# Patient Record
Sex: Male | Born: 1975 | Race: Black or African American | Hispanic: No | Marital: Married | Smoking: Current every day smoker
Health system: Southern US, Community
[De-identification: ages and names within clinical notes are randomized; demographics above are authoritative.]

## PROBLEM LIST (undated history)

## (undated) DIAGNOSIS — F32A Depression, unspecified: Secondary | ICD-10-CM

## (undated) DIAGNOSIS — F1911 Other psychoactive substance abuse, in remission: Secondary | ICD-10-CM

## (undated) DIAGNOSIS — F1910 Other psychoactive substance abuse, uncomplicated: Secondary | ICD-10-CM

## (undated) DIAGNOSIS — A150 Tuberculosis of lung: Secondary | ICD-10-CM

## (undated) DIAGNOSIS — F329 Major depressive disorder, single episode, unspecified: Secondary | ICD-10-CM

## (undated) DIAGNOSIS — R519 Headache, unspecified: Secondary | ICD-10-CM

## (undated) HISTORY — DX: Other psychoactive substance abuse, uncomplicated: F19.10

## (undated) HISTORY — DX: Headache, unspecified: R51.9

## (undated) HISTORY — DX: Depression, unspecified: F32.A

## (undated) HISTORY — DX: Major depressive disorder, single episode, unspecified: F32.9

## (undated) HISTORY — DX: Other psychoactive substance abuse, in remission: F19.11

---

## 2000-11-26 ENCOUNTER — Emergency Department (HOSPITAL_COMMUNITY): Admission: EM | Admit: 2000-11-26 | Discharge: 2000-11-26 | Payer: Self-pay

## 2005-09-29 ENCOUNTER — Emergency Department (HOSPITAL_COMMUNITY): Admission: EM | Admit: 2005-09-29 | Discharge: 2005-09-29 | Payer: Self-pay | Admitting: Emergency Medicine

## 2007-01-30 ENCOUNTER — Emergency Department (HOSPITAL_COMMUNITY): Admission: EM | Admit: 2007-01-30 | Discharge: 2007-01-30 | Payer: Self-pay | Admitting: Emergency Medicine

## 2008-12-07 ENCOUNTER — Emergency Department (HOSPITAL_COMMUNITY): Admission: EM | Admit: 2008-12-07 | Discharge: 2008-12-07 | Payer: Self-pay | Admitting: Emergency Medicine

## 2010-10-16 LAB — POCT I-STAT, CHEM 8
Chloride: 101 mEq/L (ref 96–112)
Glucose, Bld: 87 mg/dL (ref 70–99)
HCT: 45 % (ref 39.0–52.0)
Hemoglobin: 15.3 g/dL (ref 13.0–17.0)
Potassium: 3.9 mEq/L (ref 3.5–5.1)
Sodium: 140 mEq/L (ref 135–145)

## 2010-10-16 LAB — RAPID URINE DRUG SCREEN, HOSP PERFORMED
Amphetamines: NOT DETECTED
Benzodiazepines: NOT DETECTED
Tetrahydrocannabinol: POSITIVE — AB

## 2011-01-31 ENCOUNTER — Emergency Department: Payer: Self-pay | Admitting: Unknown Physician Specialty

## 2011-04-23 LAB — URINALYSIS, ROUTINE W REFLEX MICROSCOPIC
Bilirubin Urine: NEGATIVE
Nitrite: NEGATIVE
Specific Gravity, Urine: 1.027
Urobilinogen, UA: 1

## 2011-04-23 LAB — URINE CULTURE: Culture: NO GROWTH

## 2011-04-23 LAB — URINE MICROSCOPIC-ADD ON

## 2013-01-06 ENCOUNTER — Emergency Department: Payer: Self-pay | Admitting: Emergency Medicine

## 2013-06-25 ENCOUNTER — Emergency Department: Payer: Self-pay | Admitting: Emergency Medicine

## 2013-06-25 LAB — RAPID INFLUENZA A&B ANTIGENS

## 2013-12-29 ENCOUNTER — Emergency Department: Payer: Self-pay | Admitting: Emergency Medicine

## 2014-04-09 ENCOUNTER — Inpatient Hospital Stay: Payer: Self-pay | Admitting: Psychiatry

## 2014-04-09 LAB — URINALYSIS, COMPLETE
BILIRUBIN, UR: NEGATIVE
Bacteria: NONE SEEN
Glucose,UR: NEGATIVE mg/dL (ref 0–75)
Ketone: NEGATIVE
LEUKOCYTE ESTERASE: NEGATIVE
Nitrite: NEGATIVE
PH: 6 (ref 4.5–8.0)
PROTEIN: NEGATIVE
RBC,UR: <1 /HPF (ref 0–5)
SPECIFIC GRAVITY: 1.002 (ref 1.003–1.030)
Squamous Epithelial: <1
WBC UR: NONE SEEN /HPF (ref 0–5)

## 2014-04-09 LAB — CBC
HCT: 40.4 % (ref 40.0–52.0)
HGB: 13.3 g/dL (ref 13.0–18.0)
MCH: 29.6 pg (ref 26.0–34.0)
MCHC: 32.9 g/dL (ref 32.0–36.0)
MCV: 90 fL (ref 80–100)
Platelet: 165 10*3/uL (ref 150–440)
RBC: 4.48 10*6/uL (ref 4.40–5.90)
RDW: 13.1 % (ref 11.5–14.5)
WBC: 12 10*3/uL — AB (ref 3.8–10.6)

## 2014-04-09 LAB — DRUG SCREEN, URINE
Amphetamines, Ur Screen: NEGATIVE (ref ?–1000)
Barbiturates, Ur Screen: NEGATIVE (ref ?–200)
Benzodiazepine, Ur Scrn: NEGATIVE (ref ?–200)
CANNABINOID 50 NG, UR ~~LOC~~: NEGATIVE (ref ?–50)
Cocaine Metabolite,Ur ~~LOC~~: POSITIVE (ref ?–300)
MDMA (Ecstasy)Ur Screen: NEGATIVE (ref ?–500)
METHADONE, UR SCREEN: NEGATIVE (ref ?–300)
Opiate, Ur Screen: NEGATIVE (ref ?–300)
Phencyclidine (PCP) Ur S: NEGATIVE (ref ?–25)
TRICYCLIC, UR SCREEN: NEGATIVE (ref ?–1000)

## 2014-04-09 LAB — COMPREHENSIVE METABOLIC PANEL
ALBUMIN: 4.2 g/dL (ref 3.4–5.0)
ALK PHOS: 64 U/L
ANION GAP: 4 — AB (ref 7–16)
AST: 31 U/L (ref 15–37)
BUN: 8 mg/dL (ref 7–18)
Bilirubin,Total: 0.4 mg/dL (ref 0.2–1.0)
CHLORIDE: 104 mmol/L (ref 98–107)
CREATININE: 1.02 mg/dL (ref 0.60–1.30)
Calcium, Total: 9 mg/dL (ref 8.5–10.1)
Co2: 30 mmol/L (ref 21–32)
EGFR (Non-African Amer.): >60
GLUCOSE: 115 mg/dL — AB (ref 65–99)
OSMOLALITY: 275 (ref 275–301)
Potassium: 3.9 mmol/L (ref 3.5–5.1)
SGPT (ALT): 30 U/L
Sodium: 138 mmol/L (ref 136–145)
Total Protein: 7.9 g/dL (ref 6.4–8.2)

## 2014-04-09 LAB — SALICYLATE LEVEL: Salicylates, Serum: <1.7 mg/dL

## 2014-04-09 LAB — ACETAMINOPHEN LEVEL: Acetaminophen: <2 ug/mL

## 2014-04-09 LAB — ETHANOL

## 2014-05-08 ENCOUNTER — Emergency Department: Payer: Self-pay | Admitting: Emergency Medicine

## 2014-07-09 HISTORY — PX: HERNIA REPAIR: SHX51

## 2014-07-13 ENCOUNTER — Emergency Department: Payer: Self-pay | Admitting: Emergency Medicine

## 2014-07-28 ENCOUNTER — Emergency Department: Payer: Self-pay | Admitting: Emergency Medicine

## 2014-08-09 ENCOUNTER — Ambulatory Visit: Payer: Self-pay | Admitting: Surgery

## 2014-08-16 ENCOUNTER — Ambulatory Visit: Payer: Self-pay | Admitting: Surgery

## 2014-10-30 NOTE — H&P (Signed)
PATIENT NAME:  Russell Whitehead, Russell Whitehead MR#:  [id number] DATE OF BIRTH:  1975-08-01  DATE OF ADMISSION:  04/09/2014  REFERRING PHYSICIAN: Dr. Glennie Isle.  ATTENDING PHYSICIAN: Dr. Kristine Linea.  IDENTIFYING DATA: Russell Whitehead is a 39 year old man with history of substance abuse.   CHIEF COMPLAINT: "I'm going to blow my brains out if I cannot stop."  HISTORY OF PRESENT ILLNESS: Russell Whitehead has a long history of cocaine addiction. He was at [facility] more than 14 years ago for the same and reports some period of sobriety following treatment. Recently, however, he has not been able to stay away from cocaine. 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 This month, he blew his paycheck already. He did not pay child support and was unable to help his girlfriend who had some financial emergency situation. It made him feel very bad and he became acutely suicidal wanting to shoot himself. Instead he came to the Emergency Room. He reports some symptoms of depression with poor sleep, decreased appetite, anhedonia, feeling of guilt, hopelessness, worthlessness, social isolation, and now suicidal thoughts. He also endorses mood swings, and tells me that in the past he was thought to be bipolar. He has never felt so down before. He has never been treated for bipolar. He is not in the care of a psychiatrist. He denies psychotic symptoms, denies symptoms suggestive of bipolar mania. He denies other than cocaine substance use   PAST PSYCHIATRIC HISTORY: As above, 1 treatment at [facility], possibly in 2011 with some sobriety. It helps him to stick with AA and AN people. He never attempted suicide, has never been hospitalized for psychiatric reasons, takes no psychiatric medication.   FAMILY PSYCHIATRIC HISTORY: Father with depression and substance use.   PAST MEDICAL HISTORY: None.   ALLERGIES: No known drug allergies.   MEDICATIONS ON ADMISSION: None.   SOCIAL HISTORY: As  above. Lives at a boarding house, has a girlfriend, has 3 children from previous relationships and is supposed to pay child support. He works in [employer] representative and is able to make good money but it supports his crack habit entirely. He has no health insurance.   REVIEW OF SYSTEMS:  CONSTITUTIONAL: No fevers or chills. No weight changes.  EYES: No double or blurred vision.  ENT: No hearing loss.  RESPIRATORY: No shortness of breath or cough.  CARDIOVASCULAR: No chest pain or orthopnea.  GASTROINTESTINAL: No abdominal pain, nausea, vomiting, or diarrhea.  GENITOURINARY: No incontinence or frequency.  ENDOCRINE: No heat or cold intolerance.  LYMPHATIC: No anemia or easy bruising.  INTEGUMENTARY: No acne or rash.  MUSCULOSKELETAL: No muscle or joint pain.  NEUROLOGIC: No tingling or weakness.  PSYCHIATRIC: See history of present illness for details.   PHYSICAL EXAMINATION:  VITAL SIGNS: Blood pressure 116/78, pulse 78, respirations 18, temperature 98.  GENERAL: This is a well-developed male in no acute distress.  HEENT: The pupils are equal, round, and reactive to light. Sclerae are anicteric.  NECK: Supple. No thyromegaly.  LUNGS: Clear to auscultation. No dullness to percussion.  HEART: Regular rhythm and rate. No murmurs, rubs, or gallops.  ABDOMEN: Soft, nontender, nondistended. Positive bowel sounds.  MUSCULOSKELETAL: Normal muscle strength in all extremities.  SKIN: No rashes or bruises.  LYMPHATIC: No cervical adenopathy.  NEUROLOGIC: Cranial nerves II through XII are intact.   LABORATORY DATA: Chemistries are within normal limits. Blood glucose is 115, alcohol zero. LFTs within normal limits. Urine tox screen positive  for cocaine. CBC within normal limits except for white count of 12,000. The urinalysis is not suggestive of urinary tract infection. Serum acetaminophen and salicylates are low.   MENTAL STATUS EXAMINATION ON ADMISSION: The patient is alert and oriented to person,  place, time, and situation. He is pleasant, polite, and cooperative. He maintains limited eye contact. He is well groomed, wearing hospital scrubs. His speech is of normal rhythm, rate, and volume. Mood is depressed with flat affect. Thought process is logical and goal oriented. Thought content: He denies thoughts of hurting himself or others here, but was admitted after threatening to blow his brains out. He denies delusions or paranoia. There are no auditory or visual hallucinations. His cognition is grossly intact. Registration, recall, short and long-term memory are intact. He is of average intelligence and fund of knowledge. His insight and judgment are limited.   SUICIDE RISK ASSESSMENT ON ADMISSION: This is a patient with a long history of cocaine dependence who became critically aware of the consequences of his habit on his family life, became depressed and suicide.   INITIAL DIAGNOSES:   AXIS I: Major depressive episode, severe, cocaine use disorder, severe.   AXIS II: Deferred.   AXIS III: Deferred.   PLAN: The patient was admitted to [facility] Behavioral Medicine unit for safety, stabilization, and medication management. He was initially placed on suicide precautions and was closely monitored for any unsafe behaviors. He underwent full psychiatric and risk assessment. He received pharmacotherapy, individual and group psychotherapy, substance abuse counseling, and support from therapeutic milieu.  1. Suicidal ideation. He is able to contract for safety in the hospital. 2. Mood. The patient endorses mood swings up and down, hyperactivity, racing thoughts, insomnia. This may well be related to cocaine. We will offer Tegretol and see if he can tolerate it.  3. Insomnia. He will be given trazodone 100 mg.  4. Substance abuse. He probably will not be accepted to [facility] as cocaine is his only substance of abuse so we will give them a call on Monday.   DISPOSITION: Most  likely back to his boarding house.    ____________________________ Ellin GoodieJolanta B. Jennet MaduroPucilowska, MD jbp:lt D: 04/09/2014 18:33:26 ET T: 04/09/2014 19:24:27 ET JOB#: [id number]  cc: Jolanta B. Jennet MaduroPucilowska, MD, <Dictator> Shari ProwsJOLANTA B PUCILOWSKA MD ELECTRONICALLY SIGNED 04/15/2014 0:53

## 2014-10-30 NOTE — Consult Note (Signed)
PATIENT NAME:  Russell Whitehead, Russell Whitehead MR#:  [id number] DATE OF BIRTH:  Nov 19, 1975  DATE OF CONSULTATION:  04/09/2014  REFERRING PHYSICIAN:   CONSULTING PHYSICIAN:  Audery AmelJohn T. Holy Battenfield, MD  IDENTIFYING INFORMATION AND REASON FOR CONSULT: A 39 year old man who presented voluntarily to the Emergency Room.   CHIEF COMPLAINT: "I'm going to blow my brains out if I can't stop."   HISTORY OF PRESENT ILLNESS: The patient states that he is feeling depressed, overwhelmed, and having suicidal thoughts including thoughts about shooting himself. He is feeling bad for the last several days with an escalation in the last 24 hours. Major stress is that he is addicted to crack cocaine. Spends all of his paycheck on cocaine. His girlfriend needed some money for an emergency and he was unable to help her out which made him feel terrible about himself. He sleeps poorly at night. Does not have any other specific physical symptoms except for low appetite currently. Denies that he is abusing alcohol, not using any other drugs. Not on any psychiatric medicine.   PAST PSYCHIATRIC HISTORY: He went to [facility] in 2001, says that he had some sobriety after that and has been able to have some extended periods of outpatient sobriety going to Alcoholics Anonymous. Denies any history of suicide attempts in the past, never been prescribed any medicine for any psychiatric problem, has never been in a psychiatric hospital.   SOCIAL HISTORY: The patient currently lives in a boarding house. He does work doing Aeronautical engineerlandscaping and heavy labor. He has a girlfriend that he feels responsible for.   FAMILY HISTORY: Father with heavy substance abuse problem and a history of major depression.   PAST MEDICAL HISTORY: No significant ongoing medical problems.   CURRENT MEDICATIONS: None.   ALLERGIES: No known drug allergies.   SUBSTANCE ABUSE HISTORY: Several years of abuse of crack cocaine going back to nearly 2000s. Has had some extended sobriety. He denies  that he has addiction problems with other drugs.   REVIEW OF SYSTEMS: Depressed mood, suicidal ideation. No hallucinations, no delusions. Feeling tired and a little weak.  No nausea. The rest of the whole 9 point review of systems is negative.   MENTAL STATUS EXAMINATION: Neatly groomed man, looks a little underweight, cooperative with the interview. Good eye contact. Normal psychomotor activity. Speech is quiet, decreased in amount. Affect flat, tearful at times. Mood stated as depressed. Thoughts are lucid. No evidence of loosening of associations or delusions. Denies auditory or visual hallucinations. Denies any homicidal ideation. Positive suicidal ideation. Alert and oriented x 4. Can recall 3 out of 3 objects immediately and at 3 minutes. Long-term memory intact. Judgment and insight intact.   LABORATORY RESULTS: Chemistry panel, just a slightly elevated glucose of 115. Alcohol level negative. Drug screen positive for cocaine. CBC, elevated white count at 12, otherwise unremarkable.   PHYSICAL EXAMINATION: No acute physical injury or distress. Able to move all extremities, able to walk unassisted. Blood pressure 116/78, respirations 18, pulse 78, temperature 98.   ASSESSMENT: A 39 year old man with suicidal ideation, severe depression, fatigue, tearfulness, poor functioning, major stress is cocaine addiction, but requires hospitalization because of suicidality.   TREATMENT PLAN: Admit to psychiatry. Suicide, elopement, and fall precautions. Trazodone p.r.n. for sleep. Medicine provided for p.r.n. anxiety.   DIAGNOSIS PRINCIPAL AND PRIMARY:   AXIS I: Major depression, single, moderate to severe.   SECONDARY DIAGNOSES:   AXIS I: Cocaine abuse.   AXIS II: Deferred.     ____________________________ Audery AmelJohn T. Deosha Werden,  MD jtc:bu D: 04/09/2014 15:09:05 ET T: 04/09/2014 15:17:13 ET JOB#: [id number]  cc: Audery Amel, MD, <Dictator> Audery Amel MD ELECTRONICALLY SIGNED 04/15/2014  16:08

## 2014-11-07 NOTE — Op Note (Signed)
PATIENT NAME:  Archie EndoMOORE, Beau G MR#:  [id number] DATE OF BIRTH:  May 27, 1976  DATE OF PROCEDURE:  08/16/2014  PREOPERATIVE DIAGNOSIS:  Left inguinal hernia.   POSTOPERATIVE DIAGNOSIS:  Left indirect inguinal hernia.   PROCEDURE PERFORMED:  Robotically-assisted laparoscopic transabdominal preperitoneal herniorrhaphy with mesh.   SURGEON:  Bohdan Macho A. Egbert GaribaldiBird, MD    ESTIMATED BLOOD LOSS:  Minimal.   DESCRIPTION OF PROCEDURE:  With informed consent in the supine position, general endotracheal anesthesia was induced. The patient's abdomen was widely clipped of hair and sterilely prepped and draped with ChloraPrep solution. A Foley catheter was placed under sterile technique. A timeout was observed. A Veress needle was placed through a transversely-oriented skin incision above the umbilicus in the midline with elevation of the fascia with a hook elevator. A 10 mm da Vinci camera port was then placed under direct visualization and confirmation of intraperitoneal position with the camera was obtained. Then, 8.5 mm da Vinci ports were then placed on either side of the midline, 10 cm lateral to the camera port. A 10 mm bladeless trocar was placed in the left upper quadrant as the assistant port. The patient cart was then docked. Instruments were then inserted under direct visualization. This was achieved with the patient in steep Trendelenburg.   I then moved to the console. A large indirect inguinal hernia sac was identified. The peritoneum was incised from medially to laterally over the epigastric vessels, and dissection was then performed in the preperitoneal space. Cooper ligament was identified and delineated. The Spermatic cord was identified, and preservation of the ilioinguinal nerve was achieved. Dissection of the indirect inguinal hernia space demonstrated the vas and vessels medially and the hernia sac being liberated from the canal fully into the abdominal cavity, and the lipoma of the cord was also reduced.  A Bard 3DMax medium left-sided hernia patch was then brought into the field, inserted into the defect, and secured with #0 Vicryl suture twice on the Cooper ligament and several points on the anterior abdominal wall and 1 laterally, avoiding the area of the nerve. The mesh covered the hernia defect nicely. The peritoneum was then reapproximated utilizing a running V-Loc suture. Hemostasis appeared to be adequate on the operative field, and the ports were then removed under direct visualization.   The fascial defects were then reapproximated with #0 Vicryl suture and 4-0 Vicryl subcuticular in the skin. Benzoin, Steri-Strips, Telfa, and Tegaderm were then applied. The Foley catheter was removed. The patient was then subsequently extubated and taken to the recovery room in stable and satisfactory condition by anesthesia services.    ____________________________ Redge GainerMark A. Egbert GaribaldiBird, MD mab:nb D: 08/16/2014 22:08:31 ET T: 08/17/2014 05:03:09 ET JOB#: [id number]  cc: Loraine LericheMark A. Egbert GaribaldiBird, MD, <Dictator> Raynald KempMARK A Gay Moncivais MD ELECTRONICALLY SIGNED 08/24/2014 10:59

## 2015-05-20 ENCOUNTER — Emergency Department
Admission: EM | Admit: 2015-05-20 | Discharge: 2015-05-20 | Disposition: A | Discharge status: Home / Self Care | Payer: Self-pay | Attending: Emergency Medicine | Admitting: Emergency Medicine

## 2015-05-20 DIAGNOSIS — K029 Dental caries, unspecified: Secondary | ICD-10-CM | POA: Insufficient documentation

## 2015-05-20 DIAGNOSIS — G8929 Other chronic pain: Secondary | ICD-10-CM | POA: Insufficient documentation

## 2015-05-20 DIAGNOSIS — K0889 Other specified disorders of teeth and supporting structures: Secondary | ICD-10-CM | POA: Insufficient documentation

## 2015-05-20 DIAGNOSIS — K089 Disorder of teeth and supporting structures, unspecified: Secondary | ICD-10-CM

## 2015-05-20 DIAGNOSIS — H9202 Otalgia, left ear: Secondary | ICD-10-CM | POA: Insufficient documentation

## 2015-05-20 MED ORDER — TRAMADOL HCL 50 MG PO TABS: 50.0000 mg | ORAL_TABLET | Freq: Four times a day (QID) | ORAL | Status: DC | PRN

## 2015-05-20 MED ORDER — AMOXICILLIN 500 MG PO CAPS: 500.0000 mg | ORAL_CAPSULE | Freq: Three times a day (TID) | ORAL | Status: DC

## 2015-05-20 MED ORDER — IBUPROFEN 800 MG PO TABS: 800.0000 mg | ORAL_TABLET | Freq: Three times a day (TID) | ORAL | Status: DC

## 2015-05-20 NOTE — Discharge Instructions (Signed)
Been taking amoxicillin today for 10 days. Ibuprofen as needed for pain 3 times a day with food. Tramadol every 6 hours if needed for severe pain. YOU may also take Tylenol along with this medication. YOU will need to make an appointment with one of the dental clinics listed below.  OPTIONS FOR DENTAL FOLLOW UP CARE  [place] [facility] - Local Safety Net Dental Clinics [email or web address]   [facility] [phone])  [facility] Ma [phone])  [place] [phone])  [facility] [phone])  [facility] [phone]) This clinic caters to the indigent population and is on a lottery system. Location: [employer] [facility], [employer], [address] [place] Clinic Hours: Wednesdays from 6pm - 9pm, patients seen by a lottery system. For dates, call or go to [email or web address] Services: Cleanings, fillings and simple extractions. Payment Options: DENTAL WORK IS FREE OF CHARGE. Bring proof of income or support. Best way to get seen: Arrive at 5:15 pm - this is a lottery, NOT first come/first serve, so arriving earlier will not increase your chances of being seen.     [facility] [phone] Select option 1 for emergencies   Location: [facility], [place], [address] [place] Clinic Hours: No walk-ins accepted - call the day before to schedule an appointment. Check in times are 9:30 am and 1:30 pm. Services: Simple extractions, temporary fillings, pulpectomy/pulp debridement, uncomplicated abscess drainage. Payment Options: PAYMENT IS DUE AT THE TIME OF SERVICE.  Fee is usually $100-200, additional surgical procedures (e.g. abscess drainage) may be extra. Cash, checks, Visa/MasterCard accepted.  Can file Medicaid if patient is covered for  dental - patient should call case worker to check. No discount for [facility] patients. Best way to get seen: MUST call the day before and get onto the schedule. Can usually be seen the next 1-2 days. No walk-ins accepted.     [facility] [phone]   Location: [facility], [address], [place] Clinic Hours: M, W, Th, F 8am or 1:30pm, Tues 9a or 1:30 - first come/first served. Services: Simple extractions, temporary fillings, uncomplicated abscess drainage.  You do not need to be an [facility] resident. Payment Options: PAYMENT IS DUE AT THE TIME OF SERVICE. Dental insurance, otherwise sliding scale - bring proof of income or support. Depending on income and treatment needed, cost is usually $50-200. Best way to get seen: Arrive early as it is first come/first served.     [facility] [phone]   Location: [address] Clinic Hours: Mon-Thu 8a-5p Services: Most basic dental services including extractions and fillings. Payment Options: PAYMENT IS DUE AT THE TIME OF SERVICE. Sliding scale, up to 50% off - bring proof if income or support. Medicaid with dental option accepted. Best way to get seen: Call to schedule an appointment, can usually be seen within 2 weeks OR they will try to see walk-ins - show up at 8a or 2p (you may have to wait).     [facility] [phone] [place] RESIDENTS ONLY   Location: [facility], [address], [place], [place] [address] Clinic Hours: By appointment only. Monday - Thursday 8am-5pm, Friday 8am-12pm Services: Cleanings, fillings, extractions. Payment Options: PAYMENT IS DUE AT THE TIME OF SERVICE. Cash, Visa or MasterCard. Sliding scale - $30 minimum per service. Best way to get seen:  Come in to office, complete packet and make an appointment - need proof of income or support monies for each  household member and proof of [place] residence. Usually takes about a month to get in.     [facility] [phone]   Location: [address] [facility] Hours: Walk-in Urgent Care Dental Services are offered Monday-Friday mornings only. The numbers of emergencies accepted daily is limited to the number of providers available. Maximum 15 - Mondays, Wednesdays & Thursdays Maximum 10 - Tuesdays & Fridays Services: You do not need to be a [place] resident to be seen for a dental emergency. Emergencies are defined as pain, swelling, abnormal bleeding, or dental trauma. Walkins will receive x-rays if needed. NOTE: Dental cleaning is not an emergency. Payment Options: PAYMENT IS DUE AT THE TIME OF SERVICE. Minimum co-pay is $40.00 for uninsured patients. Minimum co-pay is $3.00 for Medicaid with dental coverage. Dental Insurance is accepted and must be presented at time of visit. Medicare does not cover dental. Forms of payment: Cash, credit card, checks. Best way to get seen: If not previously registered with the clinic, walk-in dental registration begins at 7:15 am and is on a first come/first serve basis. If previously registered with the clinic, call to make an appointment.     [facility] [phone] [place] RESIDENTS ONLY   Location: [address], [place], [place] Clinic Hours: Mon-Thu 10a-2p Services: Extractions only! Payment Options: FREE (donations accepted) - bring proof of income or support Best way to get seen: Call and schedule an appointment OR come at 8am on the 1st Monday of every month (except for holidays) when it is first come/first served.     [facility] [phone]   [address]: [address], [place] Clinic Hours: Friday mornings Services, Payment Options, Best way to get seen: Call for info

## 2015-05-20 NOTE — ED Provider Notes (Signed)
[facility] Emergency Department Provider Note  ____________________________________________  Time seen: Approximately 2:18 PM  I have reviewed the triage vital signs and the nursing notes.   HISTORY  Chief Complaint Dental Pain and Otalgia  HPI Russell Whitehead is a 39 y.o. male left-sided facial pain with earache for the last week. Patient states that he has "rotten teeth". He states he has been taking Tylenol at home without any relief. He denies any fever or chills. He states he does not have a dentist.Patient states that he continues to smoke less than half pack of cigarettes per day. He states he is aware that he needs to have something done about his teeth. Currently he rates his pain as an 8 out of 10.   No past medical history on file.  There are no active problems to display for this patient.   No past surgical history on file.  Current Outpatient Rx  Name  Route  Sig  Dispense  Refill  . amoxicillin (AMOXIL) 500 MG capsule   Oral   Take 1 capsule (500 mg total) by mouth 3 (three) times daily.   30 capsule   0   . ibuprofen (ADVIL,MOTRIN) 800 MG tablet   Oral   Take 1 tablet (800 mg total) by mouth 3 (three) times daily.   30 tablet   0   . traMADol (ULTRAM) 50 MG tablet   Oral   Take 1 tablet (50 mg total) by mouth every 6 (six) hours as needed.   10 tablet   0     Allergies Review of patient's allergies indicates no known allergies.  No family history on file.  Social History Social History  Substance Use Topics  . Smoking status: Not on file  . Smokeless tobacco: Not on file  . Alcohol Use: Not on file    Review of Systems Constitutional: No fever/chills ENT: No sore throat. Cardiovascular: Denies chest pain. Respiratory: Denies shortness of breath. Gastrointestinal: No nausea, no vomiting. Musculoskeletal: Negative for back pain. Skin: Negative for rash. Neurological: Negative for headaches, focal weakness or  numbness.  10-point ROS otherwise negative.  ____________________________________________   PHYSICAL EXAM:  VITAL SIGNS: ED Triage Vitals  Enc Vitals Group     BP 05/20/15 1409 125/93 mmHg     Pulse Rate 05/20/15 1409 72     Resp 05/20/15 1409 16     Temp 05/20/15 1409 98.5 F (36.9 C)     Temp Source 05/20/15 1409 Oral     SpO2 05/20/15 1409 99 %     Weight 05/20/15 1409 160 lb (72.576 kg)     Height 05/20/15 1409  (1.778 m)     Head Cir --      Peak Flow --      Pain Score 05/20/15 1408 8     Pain Loc --      Pain Edu? --      Excl. in GC? --     Constitutional: Alert and oriented. Well appearing and in no acute distress. Eyes: Conjunctivae are normal. PERRL. EOMI. Head: Atraumatic. Nose: No congestion/rhinnorhea. Mouth/Throat: Mucous membranes are moist.  Oropharynx non-erythematous. Left upper molars with multiple caries and moderate tenderness of the gums surrounding. There is no obvious abscess formation. There is some left facial edema present. Neck: No stridor.   Hematological/Lymphatic/Immunilogical: No cervical lymphadenopathy. Cardiovascular: Normal rate, regular rhythm. Grossly normal heart sounds.  Good peripheral circulation. Respiratory: Normal respiratory effort.  No retractions. Lungs CTAB.  Gastrointestinal: Soft and nontender. No distention.  Musculoskeletal: No lower extremity tenderness nor edema.  No joint effusions. Neurologic:  Normal speech and language. No gross focal neurologic deficits are appreciated. No gait instability. Skin:  Skin is warm, dry and intact. No rash noted. Psychiatric: Mood and affect are normal. Speech and behavior are normal.  ____________________________________________   LABS (all labs ordered are listed, but only abnormal results are displayed)  Labs Reviewed - No data to display  PROCEDURES  Procedure(s) performed: None  Critical Care performed:  No  ____________________________________________   INITIAL IMPRESSION / ASSESSMENT AND PLAN / ED COURSE  Pertinent labs & imaging results that were available during my care of the patient were reviewed by me and considered in my medical decision making (see chart for details).  Patient was placed on amoxicillin 500 mg 3 times a day for 10 days. He is also given 10 tablets of tramadol to be taken for severe pain. Ibuprofen 800 mg 3 times a day with food. Patient was given a list of dental clinics that he is available to go to since he does not have any insurance. He is encouraged to call these clinics on Monday. ____________________________________________   FINAL CLINICAL IMPRESSION(S) / ED DIAGNOSES  Final diagnoses:  Chronic dental pain      Tommi RumpsRhonda L Aldine Grainger, PA-C 05/20/15 1507  Emily FilbertJonathan E Williams, MD 05/20/15 1535

## 2015-05-20 NOTE — ED Notes (Signed)
Pt reports left sided facial/ear pain from tooth ache for a few days.

## 2016-01-12 ENCOUNTER — Encounter: Payer: Self-pay | Admitting: Emergency Medicine

## 2016-01-12 ENCOUNTER — Emergency Department
Admission: EM | Admit: 2016-01-12 | Discharge: 2016-01-12 | Disposition: A | Discharge status: Home / Self Care | Payer: Self-pay | Attending: Emergency Medicine | Admitting: Emergency Medicine

## 2016-01-12 DIAGNOSIS — H9202 Otalgia, left ear: Secondary | ICD-10-CM | POA: Insufficient documentation

## 2016-01-12 DIAGNOSIS — F17210 Nicotine dependence, cigarettes, uncomplicated: Secondary | ICD-10-CM | POA: Insufficient documentation

## 2016-01-12 DIAGNOSIS — K0889 Other specified disorders of teeth and supporting structures: Secondary | ICD-10-CM | POA: Insufficient documentation

## 2016-01-12 MED ORDER — AMOXICILLIN 500 MG PO CAPS
500.0000 mg | ORAL_CAPSULE | Freq: Once | ORAL | Status: AC
  Administered 2016-01-12: 500 mg via ORAL
  Filled 2016-01-12: qty 1

## 2016-01-12 MED ORDER — AMOXICILLIN 500 MG PO TABS: 500.0000 mg | ORAL_TABLET | Freq: Three times a day (TID) | ORAL | Status: DC

## 2016-01-12 NOTE — ED Provider Notes (Signed)
[facility] Emergency Department Provider Note ____________________________________________  Time seen: Approximately 10:31 PM  I have reviewed the triage vital signs and the nursing notes.   HISTORY  Chief Complaint Otalgia and Dental Pain   HPI Russell Whitehead is a 40 y.o. male who presents to the emergency department for evaluation of left ear pain and dental pain. Symptoms started 2 days ago and have worsened. Ear pain started first, then dental pain. No relief with ibuprofen.  History reviewed. No pertinent past medical history.  There are no active problems to display for this patient.   Past Surgical History  Procedure Laterality Date  . Hernia repair      Current Outpatient Rx  Name  Route  Sig  Dispense  Refill  . amoxicillin (AMOXIL) 500 MG tablet   Oral   Take 1 tablet (500 mg total) by mouth 3 (three) times daily.   30 tablet   0   . ibuprofen (ADVIL,MOTRIN) 800 MG tablet   Oral   Take 1 tablet (800 mg total) by mouth 3 (three) times daily.   30 tablet   0   . traMADol (ULTRAM) 50 MG tablet   Oral   Take 1 tablet (50 mg total) by mouth every 6 (six) hours as needed.   10 tablet   0     Allergies Review of patient's allergies indicates no known allergies.  No family history on file.  Social History Social History  Substance Use Topics  . Smoking status: Current Every Day Smoker -- 0.50 packs/day    Types: Cigarettes  . Smokeless tobacco: None  . Alcohol Use: No    Review of Systems Constitutional: Appears uncomfortable. ENT: Positive for left earache and left side dental pain. Musculoskeletal: Negative for jaw pain/trismus. Skin: Negative  ____________________________________________   PHYSICAL EXAM:  VITAL SIGNS: ED Triage Vitals  Enc Vitals Group     BP 01/12/16 2134 130/82 mmHg     Pulse Rate 01/12/16 2134 65     Resp 01/12/16 2134 18     Temp 01/12/16 2134 98.2 F (36.8 C)     Temp Source 01/12/16  2134 Oral     SpO2 01/12/16 2134 100 %     Weight 01/12/16 2134 155 lb (70.308 kg)     Height 01/12/16 2134 5\' 10"  (1.778 m)     Head Cir --      Peak Flow --      Pain Score 01/12/16 2135 7     Pain Loc --      Pain Edu? --      Excl. in GC? --     Constitutional: Alert and oriented. Well appearing and in no acute distress. Eyes: Conjunctivae are normal.EOMI. Mouth/Throat: Mucous membranes are moist. Oropharynx non-erythematous. Ears: Left EAC erythematous and appears irritated. TM normal. Periodontal Exam    Hematological/Lymphatic/Immunilogical: No cervical lymphadenopathy. Respiratory: Normal respiratory effort.  Musculoskeletal: Full ROM x 4 extremities. Neurologic:  Normal speech and language. No gross focal neurologic deficits are appreciated. Speech is normal. No gait instability. Skin:  Normal Psychiatric: Mood and affect are normal. Speech and behavior are normal.  ____________________________________________   LABS (all labs ordered are listed, but only abnormal results are displayed)  Labs Reviewed - No data to display ____________________________________________   RADIOLOGY  Not indicated. ____________________________________________   PROCEDURES  Procedure(s) performed: None  Critical Care performed: No  ____________________________________________   INITIAL IMPRESSION / ASSESSMENT AND PLAN / ED COURSE  Pertinent labs &  imaging results that were available during my care of the patient were reviewed by me and considered in my medical decision making (see chart for details). Patient will be prescribed Amoxicillin. Patient was advised to see the dentist within 14 days. Also advised to take the antibiotic until finished. Instructed to return to the ER for symptoms that change or worsen if you are unable to schedule an appointment. ____________________________________________   FINAL CLINICAL IMPRESSION(S) / ED DIAGNOSES  Final diagnoses:  Pain,  dental  Otalgia of left ear    Note:  This document was prepared using Dragon voice recognition software and may include unintentional dictation errors.    Chinita PesterCari B Zahirah Cheslock, FNP 01/12/16 2236  Arnaldo NatalPaul F Malinda, MD 01/12/16 [phone]

## 2016-01-12 NOTE — ED Notes (Signed)
Patient presents to ED with c/o LEFT ear pain and left lower toothache for the past two days. Patient reports had similar symptoms in the past, states was given antibiotics and was told to follow up with dentist. Pt reports after taking antibiotics he felt better and did not follow up with antibiotics.

## 2016-01-12 NOTE — Discharge Instructions (Signed)
OPTIONS FOR DENTAL FOLLOW UP CARE ° °[place] Department of Health and Human Services - Local Safety Net Dental Clinics °http://www.ncdhhs.gov/dph/oralhealth/services/safetynetclinics.htm °  °[facility] (336-562-3123) ° °[facility] [place] ([phone]) ° °[facility] ([phone] ext [phone]) ° °[facility] ([phone]) ° °[facility] ([phone]) °This clinic caters to the indigent population and is on a lottery system. °Location: °[facility], [address], [place] °Clinic Hours: °Wednesdays from 6pm - 9pm, patients seen by a lottery system. °For dates, call or go to [email or web address].[facility].edu/[facility]/patients/Dental-[facility] °Services: °Cleanings, fillings and simple extractions. °Payment Options: °DENTAL WORK IS FREE OF CHARGE. Bring proof of income or support. °Best way to get seen: °Arrive at 5:15 pm - this is a lottery, NOT first come/first serve, so arriving earlier will not increase your chances of being seen. °  °  °[facility] °[phone] °Select option 1 for emergencies °  °Location: °[facility], [address], [place] °Clinic Hours: °No walk-ins accepted - call the day before to schedule an appointment. °Check in times are 9:30 am and 1:30 pm. °Services: °Simple extractions, temporary fillings, pulpectomy/pulp debridement, uncomplicated abscess drainage. °Payment Options: °PAYMENT IS DUE AT THE TIME OF SERVICE.  Fee is usually $100-200, additional surgical procedures (e.g. abscess drainage) may be extra. °Cash, checks, Visa/MasterCard accepted.  Can file Medicaid if patient is covered for dental - patient should call case worker to check. °No discount for [facility] patients. °Best way to get seen: °MUST call the day before and get onto the schedule. Can usually be seen the next 1-2 days. No walk-ins accepted. °  °  °[facility] °[phone] °   °Location: °[facility], [address], [place] °Clinic Hours: °M, W, Th, F 8am or 1:30pm, Tues 9a or 1:30 - first come/first served. °Services: °Simple extractions, temporary fillings, uncomplicated abscess drainage.  You do not need to be an [place] resident. °Payment Options: °PAYMENT IS DUE AT THE TIME OF SERVICE. °Dental insurance, otherwise sliding scale - bring proof of income or support. °Depending on income and treatment needed, cost is usually $50-200. °Best way to get seen: °Arrive early as it is first come/first served. °  °  °[facility] °[phone] °  °Location: °[address] °Clinic Hours: °Mon-Thu 8a-5p °Services: °Most basic dental services including extractions and fillings. °Payment Options: °PAYMENT IS DUE AT THE TIME OF SERVICE. °Sliding scale, up to 50% off - bring proof if income or support. °Medicaid with dental option accepted. °Best way to get seen: °Call to schedule an appointment, can usually be seen within 2 weeks OR they will try to see walk-ins - show up at 8a or 2p (you may have to wait). °  °  °[facility] °[phone] °[place] RESIDENTS ONLY °  °Location: °[facility], [address], [place], [place] [address] °Clinic Hours: By appointment only. °Monday - Thursday 8am-5pm, Friday 8am-12pm °Services: Cleanings, fillings, extractions. °Payment Options: °PAYMENT IS DUE AT THE TIME OF SERVICE. °Cash, Visa or MasterCard. Sliding scale - $30 minimum per service. °Best way to get seen: °Come in to office, complete packet and make an appointment - need proof of income °or support monies for each household member and proof of [place] residence. °Usually takes about a month to get in. °  °  °[facility] °[phone] °  °Location: °[address]   [place] °Clinic Hours: Walk-in Urgent Care Dental Services are offered Monday-Friday  mornings only. °The numbers of emergencies accepted daily is limited to the number of °providers available. °Maximum 15 - Mondays, Wednesdays & Thursdays °Maximum 10 - Tuesdays & Fridays °Services: °You do not need to be a [place] resident to be seen for a dental emergency. °Emergencies are defined as pain, swelling, abnormal bleeding, or dental trauma. Walkins will receive x-rays if needed. °NOTE: Dental cleaning is not an emergency. °Payment Options: °PAYMENT IS DUE AT THE TIME OF SERVICE. °Minimum co-pay is $40.00 for uninsured patients. °Minimum co-pay is $3.00 for Medicaid with dental coverage. °Dental Insurance is accepted and must be presented at time of visit. °Medicare does not cover dental. °Forms of payment: Cash, credit card, checks. °Best way to get seen: °If not previously registered with the clinic, walk-in dental registration begins at 7:15 am and is on a first come/first serve basis. °If previously registered with the clinic, call to make an appointment. °  °  °[facility] °[phone] °[place] RESIDENTS ONLY °  °Location: °[address], [place], [place] °Clinic Hours: °Mon-Thu 10a-2p °Services: Extractions only! °Payment Options: °FREE (donations accepted) - bring proof of income or support °Best way to get seen: °Call and schedule an appointment OR come at 8am on the 1st Monday of every month (except for holidays) when it is first come/first served. °  °  °[facility] °[phone] °  °Location: °[address], [place] °Clinic Hours: °Friday mornings °Services, Payment Options, Best way to get seen: °Call for info °

## 2016-09-14 ENCOUNTER — Emergency Department
Admission: EM | Admit: 2016-09-14 | Discharge: 2016-09-16 | Disposition: A | Discharge status: Other Acute Inpatient Hospital | Payer: Self-pay | Attending: Emergency Medicine | Admitting: Emergency Medicine

## 2016-09-14 DIAGNOSIS — R45851 Suicidal ideations: Secondary | ICD-10-CM

## 2016-09-14 DIAGNOSIS — F1410 Cocaine abuse, uncomplicated: Secondary | ICD-10-CM

## 2016-09-14 DIAGNOSIS — Z791 Long term (current) use of non-steroidal anti-inflammatories (NSAID): Secondary | ICD-10-CM | POA: Insufficient documentation

## 2016-09-14 DIAGNOSIS — F319 Bipolar disorder, unspecified: Secondary | ICD-10-CM | POA: Insufficient documentation

## 2016-09-14 DIAGNOSIS — Z79899 Other long term (current) drug therapy: Secondary | ICD-10-CM | POA: Insufficient documentation

## 2016-09-14 DIAGNOSIS — F1994 Other psychoactive substance use, unspecified with psychoactive substance-induced mood disorder: Secondary | ICD-10-CM

## 2016-09-14 DIAGNOSIS — F17210 Nicotine dependence, cigarettes, uncomplicated: Secondary | ICD-10-CM | POA: Insufficient documentation

## 2016-09-14 LAB — URINALYSIS, COMPLETE (UACMP) WITH MICROSCOPIC
BACTERIA UA: NONE SEEN
Bilirubin Urine: NEGATIVE
Glucose, UA: NEGATIVE mg/dL
Ketones, ur: NEGATIVE mg/dL
Leukocytes, UA: NEGATIVE
Nitrite: NEGATIVE
Protein, ur: NEGATIVE mg/dL
SPECIFIC GRAVITY, URINE: 1.006 (ref 1.005–1.030)
pH: 7 (ref 5.0–8.0)

## 2016-09-14 LAB — COMPREHENSIVE METABOLIC PANEL
ALBUMIN: 4.8 g/dL (ref 3.5–5.0)
ALK PHOS: 52 U/L (ref 38–126)
ALT: 11 U/L — ABNORMAL LOW (ref 17–63)
AST: 21 U/L (ref 15–41)
Anion gap: 7 (ref 5–15)
BILIRUBIN TOTAL: 0.6 mg/dL (ref 0.3–1.2)
BUN: 10 mg/dL (ref 6–20)
CALCIUM: 9.7 mg/dL (ref 8.9–10.3)
CO2: 28 mmol/L (ref 22–32)
Chloride: 100 mmol/L — ABNORMAL LOW (ref 101–111)
Creatinine, Ser: 0.98 mg/dL (ref 0.61–1.24)
GFR calc Af Amer: >60 mL/min (ref >60)
GFR calc non Af Amer: >60 mL/min (ref >60)
GLUCOSE: 110 mg/dL — AB (ref 65–99)
Potassium: 3.6 mmol/L (ref 3.5–5.1)
Sodium: 135 mmol/L (ref 135–145)
TOTAL PROTEIN: 8.2 g/dL — AB (ref 6.5–8.1)

## 2016-09-14 LAB — URINE DRUG SCREEN, QUALITATIVE (ARMC ONLY)
Amphetamines, Ur Screen: NOT DETECTED
BARBITURATES, UR SCREEN: NOT DETECTED
Benzodiazepine, Ur Scrn: NOT DETECTED
COCAINE METABOLITE, UR ~~LOC~~: POSITIVE — AB
Cannabinoid 50 Ng, Ur ~~LOC~~: NOT DETECTED
MDMA (ECSTASY) UR SCREEN: NOT DETECTED
METHADONE SCREEN, URINE: NOT DETECTED
Opiate, Ur Screen: NOT DETECTED
Phencyclidine (PCP) Ur S: NOT DETECTED
TRICYCLIC, UR SCREEN: NOT DETECTED

## 2016-09-14 LAB — CBC
HEMATOCRIT: 41.9 % (ref 40.0–52.0)
HEMOGLOBIN: 14.1 g/dL (ref 13.0–18.0)
MCH: 29.8 pg (ref 26.0–34.0)
MCHC: 33.6 g/dL (ref 32.0–36.0)
MCV: 88.6 fL (ref 80.0–100.0)
Platelets: 175 10*3/uL (ref 150–440)
RBC: 4.73 MIL/uL (ref 4.40–5.90)
RDW: 13.5 % (ref 11.5–14.5)
WBC: 11.3 10*3/uL — AB (ref 3.8–10.6)

## 2016-09-14 LAB — ETHANOL: Alcohol, Ethyl (B): <5 mg/dL (ref <5)

## 2016-09-14 LAB — SALICYLATE LEVEL: Salicylate Lvl: <7 mg/dL (ref 2.8–30.0)

## 2016-09-14 LAB — ACETAMINOPHEN LEVEL

## 2016-09-14 NOTE — ED Notes (Signed)
Patient transferred from quad, patient in room 2, nurse received report from [facility] RN

## 2016-09-14 NOTE — ED Notes (Signed)

## 2016-09-14 NOTE — ED Notes (Signed)
Russell Whitehead  From social work in room with at this time
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
## 2016-09-14 NOTE — ED Notes (Signed)
Patient ate 100% of supper and had beverage. No behavioral issues. q 15 minute checks and camera surveillance in progress.

## 2016-09-14 NOTE — ED Notes (Signed)
Patient reports having SI for a few days. Reports having those thoughts before. No Plan. Patient has a hx of bipolar and was on tegretol before but states it was not helping so it was stopped about a year and a half ago. Unclear if it was stopped by the patient or doctor. Patient does have ETOH intake but denies any prior to coming in. Admits to snorting crack PTA. Patient has been doing crack and powdered cocaine for the last 2 months about every couple weeks.

## 2016-09-14 NOTE — ED Notes (Signed)
Report was received from Wendy L., RN; Pt. Verbalizes no complaints or distress; verbalizes having  S.I.; denies having Hi. Continue to monitor with 15 min. Monitoring. 

## 2016-09-14 NOTE — BH Assessment (Signed)
Pt referred to [facility]. Pt chart under review by [facility].
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
## 2016-09-14 NOTE — ED Notes (Signed)
Patient had PM snack. 

## 2016-09-14 NOTE — ED Notes (Signed)
Breakfast was warmed up patient eating

## 2016-09-14 NOTE — ED Provider Notes (Signed)
[facility] Emergency Department Provider Note  ____________________________________________  Time seen: Approximately 5:34 AM  I have reviewed the triage vital signs and the nursing notes.   HISTORY  Chief Complaint Suicidal   HPI Archie EndoBruce G Oswald is a 41 y.o. male a history of bipolar disorder who presents for evaluation of suicidal ideation. Patient reports that he has been off of his Tegretol for a year and a half. Has been having on and off suicidal ideation. Currently for the last few days. No plan. No prior suicide attempts. Patient also endorses that his been using cocaine and crack. Last crack was before coming into the hospital. He snorts both of it. Last cocaine was 2 weeks ago. He denies alcohol. Denies any medical problems.  No past medical history on file.  There are no active problems to display for this patient.   Past Surgical History:  Procedure Laterality Date  . HERNIA REPAIR      Prior to Admission medications   Medication Sig Start Date End Date Taking? Authorizing Provider  amoxicillin (AMOXIL) 500 MG tablet Take 1 tablet (500 mg total) by mouth 3 (three) times daily. 01/12/16   Chinita Pesterari B Triplett, FNP  ibuprofen (ADVIL,MOTRIN) 800 MG tablet Take 1 tablet (800 mg total) by mouth 3 (three) times daily. 05/20/15   Tommi Rumpshonda L Summers, PA-C  traMADol (ULTRAM) 50 MG tablet Take 1 tablet (50 mg total) by mouth every 6 (six) hours as needed. 05/20/15   Tommi Rumpshonda L Summers, PA-C    Allergies Patient has no known allergies.  No family history on file.  Social History Social History  Substance Use Topics  . Smoking status: Current Every Day Smoker    Packs/day: 0.50    Types: Cigarettes  . Smokeless tobacco: Not on file  . Alcohol use No    Review of Systems  Constitutional: Negative for fever. Eyes: Negative for visual changes. ENT: Negative for sore throat. Neck: No neck pain  Cardiovascular: Negative for chest pain. Respiratory:  Negative for shortness of breath. Gastrointestinal: Negative for abdominal pain, vomiting or diarrhea. Genitourinary: Negative for dysuria. Musculoskeletal: Negative for back pain. Skin: Negative for rash. Neurological: Negative for headaches, weakness or numbness. Psych: + SI. No HI  ____________________________________________   PHYSICAL EXAM:  VITAL SIGNS: ED Triage Vitals [09/14/16 0505]  Enc Vitals Group     BP (!) 146/79     Pulse Rate 61     Resp 18     Temp 98 F (36.7 C)     Temp Source Oral     SpO2 99 %     Weight 155 lb (70.3 kg)     Height 5\' 10"  (1.778 m)     Head Circumference      Peak Flow      Pain Score      Pain Loc      Pain Edu?      Excl. in GC?     Constitutional: Alert and oriented. Well appearing and in no apparent distress. HEENT:      Head: Normocephalic and atraumatic.         Eyes: Conjunctivae are normal. Sclera is non-icteric. EOMI. PERRL      Mouth/Throat: Mucous membranes are moist.       Neck: Supple with no signs of meningismus. Cardiovascular: Regular rate and rhythm. No murmurs, gallops, or rubs. 2+ symmetrical distal pulses are present in all extremities. No JVD. Respiratory: Normal respiratory effort. Lungs are clear to auscultation  bilaterally. No wheezes, crackles, or rhonchi.  Gastrointestinal: Soft, non tender, and non distended with positive bowel sounds. No rebound or guarding. Genitourinary: No CVA tenderness. Musculoskeletal: Nontender with normal range of motion in all extremities. No edema, cyanosis, or erythema of extremities. Neurologic: Normal speech and language. Face is symmetric. Moving all extremities. No gross focal neurologic deficits are appreciated. Skin: Skin is warm, dry and intact. No rash noted. Psychiatric: Mood and affect are normal. Speech and behavior are normal. SI with no plan  ____________________________________________   LABS (all labs ordered are listed, but only abnormal results are  displayed)  Labs Reviewed  CBC - Abnormal; Notable for the following:       Result Value   WBC 11.3 (*)    All other components within normal limits  COMPREHENSIVE METABOLIC PANEL - Abnormal; Notable for the following:    Chloride 100 (*)    Glucose, Bld 110 (*)    Total Protein 8.2 (*)    ALT 11 (*)    All other components within normal limits  ACETAMINOPHEN LEVEL - Abnormal; Notable for the following:    Acetaminophen (Tylenol), Serum <10 (*)    All other components within normal limits  ETHANOL  SALICYLATE LEVEL  URINALYSIS, COMPLETE (UACMP) WITH MICROSCOPIC  URINE DRUG SCREEN, QUALITATIVE ([facility] ONLY)   ____________________________________________  EKG  none  ____________________________________________  RADIOLOGY  none  ____________________________________________   PROCEDURES  Procedure(s) performed: None Procedures Critical Care performed:  None ____________________________________________   INITIAL IMPRESSION / ASSESSMENT AND PLAN / ED COURSE   41 y.o. 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 IVC paper initiated. PSych consulted. Labs for medical clearance pending.      Pertinent labs & imaging results that were available during my care of the patient were reviewed by me and considered in my medical decision making (see chart for details).    ____________________________________________   FINAL CLINICAL IMPRESSION(S) / ED DIAGNOSES  Final diagnoses:  Suicidal ideation      NEW MEDICATIONS STARTED DURING THIS VISIT:  New Prescriptions   No medications on file     Note:  This document was prepared using Dragon voice recognition software and may include unintentional dictation errors.    Nita Sickle, MD 09/14/16 [phone]

## 2016-09-14 NOTE — Consult Note (Signed)
[facility] Consult   Reason for Consult:  Consult for 41 year old man came to the hospital with depression and suicidal ideation out-of-control substance abuse Referring Physician:  Quentin Cornwall Patient Identification: Russell Whitehead MRN:  [id number] Principal Diagnosis: Substance induced mood disorder ([place]) Diagnosis:   Patient Active Problem List   Diagnosis Date Noted  . Cocaine abuse [F14.10] 09/14/2016  . Substance induced mood disorder [facility]) [F19.94] 09/14/2016    Total Time spent with patient: 1 hour  Subjective:   GREGARY BLACKARD is a 41 y.o. male patient admitted with "I'm not doing good at all, otherwise I wouldn't be here".  HPI:  Patient interviewed chart reviewed. This is a 40 year old man with a history of substance abuse who came into the hospital with suicidal ideation depression and confusion. He says about 2 weeks ago he left his [place] and relapsed into heavy cocaine use. Has been smoking crack ever since. Last night found himself outside high on cocaine with no place to go. Felt overwhelmed with negativity and suicidal ideation and had himself brought to the hospital. He feels like he can't control his thinking. He doesn't understand himself. Feels down and depressed and hopeless. Hasn't been sleeping very well. He says he had a little bit of alcohol recently but normally doesn't drink and isn't using any other drugs. Not currently getting any kind of mental health treatment.  Social history: He had been residing in an [place]. Sounds like he's been struggling with on and off sobriety for years. He has several children none of whom he stays in regular touch with. Not working right now having lost his job when he relapsed.  Medical history: History of other medical problems no known hypertension diabetes or heart disease.  Substance abuse history: He says he's had problems with cocaine together for about 12 years although he's had extended periods of  sobriety during that time. He has been to the alcohol and drug abuse treatment Center in [place] before.  Past Psychiatric History: No history of suicide attempts. He was prescribed Tegretol at one time in the past for mood stability. He doesn't think it was very helpful for him. He has been a client at [facility] in the past but has dropped out of treatment.  Risk to Self: Suicidal Ideation: Yes-Currently Present Suicidal Intent: No-Not Currently/Within Last 6 Months Is patient at risk for suicide?: Yes Suicidal Plan?: No-Not Currently/Within Last 6 Months Access to Means:  (n/a) What has been your use of drugs/alcohol within the last 12 months?: Cocaine use (hx of alcoholism ) How many times?: 0 Other Self Harm Risks: Drug use  Triggers for Past Attempts: Other (Comment) (n/a) Intentional Self Injurious Behavior: None Risk to Others: Homicidal Ideation: No Thoughts of Harm to Others: No Current Homicidal Intent: No Current Homicidal Plan: No Access to Homicidal Means: No Identified Victim: n/a History of harm to others?: No Assessment of Violence: On admission Violent Behavior Description: n/a Does patient have access to weapons?: No Criminal Charges Pending?: No Does patient have a court date: No Prior Inpatient Therapy: Prior Inpatient Therapy: Yes Prior Therapy Dates: 2015 Prior Therapy Facilty/Provider(s): [facility] Reason for Treatment: SI/SA Prior Outpatient Therapy: Prior Outpatient Therapy: Yes Prior Therapy Dates: Distant past  Prior Therapy Facilty/Provider(s): [facility] Reason for Treatment: Bipolar Disorder  Does patient have an ACCT team?: No Does patient have Intensive In-House Services?  : No Does patient have [facility] services? : No Does patient have P4CC services?: No  Past Medical History:  No past medical history on file.  Past Surgical History:  Procedure Laterality Date  . HERNIA REPAIR     Family History: No family history on file. Family Psychiatric  History: He says  his father had depression and his sister is just generally unstable. Social History:  History  Alcohol Use No     History  Drug use: Unknown    Social History   Social History  . Marital status: Single    Spouse name: N/A  . Number of children: N/A  . Years of education: N/A   Social History Main Topics  . Smoking status: Current Every Day Smoker    Packs/day: 0.50    Types: Cigarettes  . Smokeless tobacco: Not on file  . Alcohol use No  . Drug use: Unknown  . Sexual activity: Not on file   Other Topics Concern  . Not on file   Social History Narrative  . No narrative on file   Additional Social History:    Allergies:  No Known Allergies  Labs:  Results for orders placed or performed during the hospital encounter of 09/14/16 (from the past 48 hour(s))  CBC     Status: Abnormal   Collection Time: 09/14/16  5:06 AM  Result Value Ref Range   WBC 11.3 (H) 3.8 - 10.6 K/uL   RBC 4.73 4.40 - 5.90 MIL/uL   Hemoglobin 14.1 13.0 - 18.0 g/dL   HCT 77.5 61.9 - 71.8 %   MCV 88.6 80.0 - 100.0 fL   MCH 29.8 26.0 - 34.0 pg   MCHC 33.6 32.0 - 36.0 g/dL   RDW 56.9 26.9 - 97.8 %   Platelets 175 150 - 440 K/uL  Comprehensive metabolic panel     Status: Abnormal   Collection Time: 09/14/16  5:06 AM  Result Value Ref Range   Sodium 135 135 - 145 mmol/L   Potassium 3.6 3.5 - 5.1 mmol/L   Chloride 100 (L) 101 - 111 mmol/L   CO2 28 22 - 32 mmol/L   Glucose, Bld 110 (H) 65 - 99 mg/dL   BUN 10 6 - 20 mg/dL   Creatinine, Ser 7.46 0.61 - 1.24 mg/dL   Calcium 9.7 8.9 - 63.5 mg/dL   Total Protein 8.2 (H) 6.5 - 8.1 g/dL   Albumin 4.8 3.5 - 5.0 g/dL   AST 21 15 - 41 U/L   ALT 11 (L) 17 - 63 U/L   Alkaline Phosphatase 52 38 - 126 U/L   Total Bilirubin 0.6 0.3 - 1.2 mg/dL   GFR calc non Af Amer >60 >60 mL/min   GFR calc Af Amer >60 >60 mL/min    Comment: (NOTE) The eGFR has been calculated using the CKD EPI equation. This calculation has not been validated in all clinical  situations. eGFR's persistently <60 mL/min signify possible Chronic Kidney Disease.    Anion gap 7 5 - 15  Ethanol     Status: None   Collection Time: 09/14/16  5:06 AM  Result Value Ref Range   Alcohol, Ethyl (B) <5 <5 mg/dL    Comment:        LOWEST DETECTABLE LIMIT FOR SERUM ALCOHOL IS 5 mg/dL FOR MEDICAL PURPOSES ONLY   Salicylate level     Status: None   Collection Time: 09/14/16  5:06 AM  Result Value Ref Range   Salicylate Lvl <7.0 2.8 - 30.0 mg/dL  Acetaminophen level     Status: Abnormal   Collection Time: 09/14/16  5:06  AM  Result Value Ref Range   Acetaminophen (Tylenol), Serum <10 (L) 10 - 30 ug/mL    Comment:        THERAPEUTIC CONCENTRATIONS VARY SIGNIFICANTLY. A RANGE OF 10-30 ug/mL MAY BE AN EFFECTIVE CONCENTRATION FOR MANY PATIENTS. HOWEVER, SOME ARE BEST TREATED AT CONCENTRATIONS OUTSIDE THIS RANGE. ACETAMINOPHEN CONCENTRATIONS >150 ug/mL AT 4 HOURS AFTER INGESTION AND >50 ug/mL AT 12 HOURS AFTER INGESTION ARE OFTEN ASSOCIATED WITH TOXIC REACTIONS.   Urinalysis, Complete w Microscopic     Status: Abnormal   Collection Time: 09/14/16  9:02 AM  Result Value Ref Range   Color, Urine STRAW (A) YELLOW   APPearance CLEAR (A) CLEAR   Specific Gravity, Urine 1.006 1.005 - 1.030   pH 7.0 5.0 - 8.0   Glucose, UA NEGATIVE NEGATIVE mg/dL   Hgb urine dipstick SMALL (A) NEGATIVE   Bilirubin Urine NEGATIVE NEGATIVE   Ketones, ur NEGATIVE NEGATIVE mg/dL   Protein, ur NEGATIVE NEGATIVE mg/dL   Nitrite NEGATIVE NEGATIVE   Leukocytes, UA NEGATIVE NEGATIVE   RBC / HPF 0-5 0 - 5 RBC/hpf   WBC, UA 0-5 0 - 5 WBC/hpf   Bacteria, UA NONE SEEN NONE SEEN   Squamous Epithelial / LPF 0-5 (A) NONE SEEN   Mucous PRESENT   Urine Drug Screen, Qualitative ([facility] only)     Status: Abnormal   Collection Time: 09/14/16  9:02 AM  Result Value Ref Range   Tricyclic, Ur Screen NONE DETECTED NONE DETECTED   Amphetamines, Ur Screen NONE DETECTED NONE DETECTED   MDMA (Ecstasy)Ur  Screen NONE DETECTED NONE DETECTED   Cocaine Metabolite,Ur [place] POSITIVE (A) NONE DETECTED   Opiate, Ur Screen NONE DETECTED NONE DETECTED   Phencyclidine (PCP) Ur S NONE DETECTED NONE DETECTED   Cannabinoid 50 Ng, Ur [place] NONE DETECTED NONE DETECTED   Barbiturates, Ur Screen NONE DETECTED NONE DETECTED   Benzodiazepine, Ur Scrn NONE DETECTED NONE DETECTED   Methadone Scn, Ur NONE DETECTED NONE DETECTED    Comment: (NOTE) 756  Tricyclics, urine               Cutoff 1000 ng/mL 200  Amphetamines, urine             Cutoff 1000 ng/mL 300  MDMA (Ecstasy), urine           Cutoff 500 ng/mL 400  Cocaine Metabolite, urine       Cutoff 300 ng/mL 500  Opiate, urine                   Cutoff 300 ng/mL 600  Phencyclidine (PCP), urine      Cutoff 25 ng/mL 700  Cannabinoid, urine              Cutoff 50 ng/mL 800  Barbiturates, urine             Cutoff 200 ng/mL 900  Benzodiazepine, urine           Cutoff 200 ng/mL 1000 Methadone, urine                Cutoff 300 ng/mL 1100 1200 The urine drug screen provides only a preliminary, unconfirmed 1300 analytical test result and should not be used for non-medical 1400 purposes. Clinical consideration and professional judgment should 1500 be applied to any positive drug screen result due to possible 1600 interfering substances. A more specific alternate chemical method 1700 must be used in order to obtain a confirmed analytical result.  [place] /  mass spectrometry (GC/MS) is the preferred 1900 confirmatory method.     No current facility-administered medications for this encounter.    Current Outpatient Prescriptions  Medication Sig Dispense Refill  . ibuprofen (ADVIL,MOTRIN) 800 MG tablet Take 1 tablet (800 mg total) by mouth 3 (three) times daily. (Patient not taking: Reported on 09/14/2016) 30 tablet 0  . traMADol (ULTRAM) 50 MG tablet Take 1 tablet (50 mg total) by mouth every 6 (six) hours as needed. (Patient not taking: Reported on  09/14/2016) 10 tablet 0    Musculoskeletal: Strength & Muscle Tone: within normal limits Gait & Station: normal Patient leans: N/A  Psychiatric Specialty Exam: Physical Exam  Nursing note and vitals reviewed. Constitutional: He appears well-developed and well-nourished.  HENT:  Head: Normocephalic and atraumatic.  Eyes: Conjunctivae are normal. Pupils are equal, round, and reactive to light.  Neck: Normal range of motion.  Cardiovascular: Regular rhythm and normal heart sounds.   Respiratory: Effort normal. No respiratory distress.  GI: Soft.  Musculoskeletal: Normal range of motion.  Neurological: He is alert.  Skin: Skin is warm and dry.  Psychiatric: His affect is blunt. His speech is delayed. He is slowed and withdrawn. He expresses impulsivity. He exhibits a depressed mood. He expresses suicidal ideation. He expresses no suicidal plans. He exhibits abnormal recent memory.    Review of Systems  Constitutional: Negative.   HENT: Negative.   Eyes: Negative.   Respiratory: Negative.   Cardiovascular: Negative.   Gastrointestinal: Negative.   Musculoskeletal: Negative.   Skin: Negative.   Neurological: Negative.   Psychiatric/Behavioral: Positive for depression, substance abuse and suicidal ideas. Negative for hallucinations and memory loss. The patient is nervous/anxious. The patient does not have insomnia.     Blood pressure (!) 141/74, pulse 62, temperature 97.9 F (36.6 C), temperature source Oral, resp. rate 16, height '5\' 10"'$  (1.778 m), weight 70.3 kg (155 lb), SpO2 99 %.Body mass index is 22.24 kg/m.  General Appearance: Casual  Eye Contact:  Fair  Speech:  Slow  Volume:  Decreased  Mood:  Depressed  Affect:  Blunt  Thought Process:  Goal Directed  Orientation:  Full (Time, Place, and Person)  Thought Content:  Logical  Suicidal Thoughts:  Yes.  without intent/plan  Homicidal Thoughts:  No  Memory:  Immediate;   Fair Recent;   Poor Remote;   Fair  Judgement:   Fair  Insight:  Fair  Psychomotor Activity:  Decreased  Concentration:  Concentration: Fair  Recall:  [employer] of Knowledge:  Fair  Language:  Fair  Akathisia:  No  Handed:  Right  AIMS (if indicated):     Assets:  Communication Skills Desire for Improvement Physical Health Resilience  ADL's:  Intact  Cognition:  WNL  Sleep:        Treatment Plan Summary: Plan 41 year old man with cocaine abuse mood disorder possible depression. He is upset dysphoric and pleading to be in the hospital feeling like he is out of control with his thinking. Passive suicidal thoughts. Patient will be admitted to the psychiatric unit. When necessary medications just for sleep and anxiety. Engage in regular groups. 15 minute checks. Patient agreeable to the plan.  Disposition: Recommend psychiatric Inpatient admission when medically cleared. Supportive therapy provided about ongoing stressors.  Alethia Berthold, MD 09/14/2016 4:23 PM

## 2016-09-14 NOTE — ED Notes (Signed)
Breakfast placed in room patient sleeping at this time ,will warm up when patient is awake

## 2016-09-14 NOTE — ED Notes (Signed)
Patient unable to void at this time

## 2016-09-14 NOTE — ED Notes (Signed)
Patient in bathroom at this time.

## 2016-09-14 NOTE — ED Triage Notes (Signed)
Pt here for suicidal thoughts states has had them for a while but severe today. States also has a crack problem, has a dx of bipolar.

## 2016-09-14 NOTE — ED Notes (Signed)
Pt was given food tray.  

## 2016-09-15 NOTE — ED Provider Notes (Signed)
-----------------------------------------   6:10 AM on 09/15/2016 -----------------------------------------   Blood pressure (!) 91/52, pulse 63, temperature 98.5 F (36.9 C), temperature source Oral, resp. rate 16, height 5\' 10"  (1.778 m), weight 155 lb (70.3 kg), SpO2 95 %.  The patient had no acute events since last update.  Calm and cooperative at this time.  Disposition is pending Psychiatry/Behavioral Medicine team recommendations.     Irean HongJade J Sung, MD 09/15/16 [phone]

## 2016-09-15 NOTE — BH Assessment (Addendum)
[facility]  Patient assigned to room 306 bed 1 Accepting physician is Dr. Jama Flavorsobos.  Pt may transport 3.11.18 after 0800. Call report to [phone].  [facility] [place]  ER Staff is aware of it Delaney Meigs([place], ER Sect.; Dr. Mayford KnifeWilliams, ER MD & Claris CheMargaret Patient's Nurse)

## 2016-09-15 NOTE — BH Assessment (Signed)
Pt placement referral has been submitted to the following facilities:   [facility]  [facility]  [facility]  [facility]  [facility]

## 2016-09-15 NOTE — ED Notes (Signed)
Pt states he has a f [employer] truck silver in ed parking lot security notified

## 2016-09-15 NOTE — ED Notes (Signed)
No update at this time on disposition

## 2016-09-15 NOTE — ED Notes (Signed)
Breakfast placed in room , pt sleeping resps easy

## 2016-09-15 NOTE — ED Notes (Signed)
Pt still c/o depression and vauge si and no plan, he is aware he is waiting for a in patient bed and is agreeable to the plan

## 2016-09-15 NOTE — BH Assessment (Signed)
Writer [facility] [phone].

## 2016-09-15 NOTE — BH Assessment (Signed)
[facility] possible [facility].

## 2016-09-15 NOTE — ED Notes (Signed)
Pt is concerned that if he is transferred elsewhere for adm that he has his vehicle here in parking lot.will notify security

## 2016-09-16 ENCOUNTER — Encounter (HOSPITAL_COMMUNITY): Payer: Self-pay

## 2016-09-16 ENCOUNTER — Inpatient Hospital Stay (HOSPITAL_COMMUNITY)
Admission: AD | Admit: 2016-09-16 | Discharge: 2016-09-21 | DRG: 885 | Disposition: A | Discharge status: Home / Self Care | Payer: No Typology Code available for payment source | Attending: Psychiatry | Admitting: Psychiatry

## 2016-09-16 DIAGNOSIS — G4700 Insomnia, unspecified: Secondary | ICD-10-CM | POA: Diagnosis present

## 2016-09-16 DIAGNOSIS — F419 Anxiety disorder, unspecified: Secondary | ICD-10-CM | POA: Diagnosis present

## 2016-09-16 DIAGNOSIS — Z818 Family history of other mental and behavioral disorders: Secondary | ICD-10-CM | POA: Diagnosis not present

## 2016-09-16 DIAGNOSIS — F19980 Other psychoactive substance use, unspecified with psychoactive substance-induced anxiety disorder: Secondary | ICD-10-CM | POA: Diagnosis not present

## 2016-09-16 DIAGNOSIS — F1420 Cocaine dependence, uncomplicated: Secondary | ICD-10-CM | POA: Diagnosis not present

## 2016-09-16 DIAGNOSIS — R45851 Suicidal ideations: Secondary | ICD-10-CM | POA: Diagnosis not present

## 2016-09-16 DIAGNOSIS — F3162 Bipolar disorder, current episode mixed, moderate: Secondary | ICD-10-CM | POA: Diagnosis present

## 2016-09-16 DIAGNOSIS — Z79899 Other long term (current) drug therapy: Secondary | ICD-10-CM | POA: Diagnosis not present

## 2016-09-16 DIAGNOSIS — F332 Major depressive disorder, recurrent severe without psychotic features: Secondary | ICD-10-CM | POA: Diagnosis not present

## 2016-09-16 DIAGNOSIS — F17210 Nicotine dependence, cigarettes, uncomplicated: Secondary | ICD-10-CM | POA: Diagnosis present

## 2016-09-16 DIAGNOSIS — F17200 Nicotine dependence, unspecified, uncomplicated: Secondary | ICD-10-CM | POA: Diagnosis present

## 2016-09-16 MED ORDER — HYDROXYZINE HCL 25 MG PO TABS
25.0000 mg | ORAL_TABLET | Freq: Three times a day (TID) | ORAL | Status: DC | PRN
  Filled 2016-09-16: qty 10

## 2016-09-16 MED ORDER — TRAZODONE HCL 50 MG PO TABS
50.0000 mg | ORAL_TABLET | Freq: Every day | ORAL | Status: DC
  Filled 2016-09-16: qty 1
  Filled 2016-09-16 (×2): qty 7
  Filled 2016-09-16 (×5): qty 1

## 2016-09-16 MED ORDER — ACETAMINOPHEN 325 MG PO TABS: 650.0000 mg | ORAL_TABLET | Freq: Four times a day (QID) | ORAL | Status: DC | PRN

## 2016-09-16 MED ORDER — OLANZAPINE 5 MG PO TBDP
5.0000 mg | ORAL_TABLET | Freq: Three times a day (TID) | ORAL | Status: DC | PRN
  Filled 2016-09-16: qty 10

## 2016-09-16 MED ORDER — MAGNESIUM HYDROXIDE 400 MG/5ML PO SUSP: 30.0000 mL | Freq: Every day | ORAL | Status: DC | PRN

## 2016-09-16 MED ORDER — ALUM & MAG HYDROXIDE-SIMETH 200-200-20 MG/5ML PO SUSP: 30.0000 mL | ORAL | Status: DC | PRN

## 2016-09-16 MED ORDER — SERTRALINE HCL 25 MG PO TABS
25.0000 mg | ORAL_TABLET | Freq: Every day | ORAL | Status: DC
  Administered 2016-09-16 – 2016-09-18 (×3): 25 mg via ORAL
  Filled 2016-09-16 (×5): qty 1

## 2016-09-16 NOTE — Tx Team (Signed)
Initial Treatment Plan 09/16/2016 11:37 AM Russell Whitehead Russell Whitehead [id number]    PATIENT STRESSORS: Financial difficulties Substance abuse   PATIENT STRENGTHS: Motivation for treatment/growth   PATIENT IDENTIFIED PROBLEMS: Depression  Anxiety  Substance abuse  "Follow up plan to keep me off these drugs"  "Plan to deal with this mental stuff"             DISCHARGE CRITERIA:  Ability to meet basic life and health needs Adequate post-discharge living arrangements Improved stabilization in mood, thinking, and/or behavior Withdrawal symptoms are absent or subacute and managed without 24-hour nursing intervention  PRELIMINARY DISCHARGE PLAN: Attend aftercare/continuing care group Attend 12-step recovery group Outpatient therapy  PATIENT/FAMILY INVOLVEMENT: This treatment plan has been presented to and reviewed with the patient, Russell Whitehead.  The patient has been given the opportunity to ask questions and make suggestions.  Russell BarPenny G Lisandra Mathisen, RN 09/16/2016, 11:37 AM

## 2016-09-16 NOTE — Progress Notes (Addendum)
Patient is a 41 year old man admitted IVC from [facility].   Patient spent admit interview facing away from nurse with his hands over his mouth and no eye contact. Patient did not answer questions, except for sounds and head shakes. Patient did state that he was, "Trying to get it out", meaning his answers. Minimal information given by patient. Patient affect was very flat and irritable. Patient also appeared paranoid.   [facility], [place]. [place] [facility] made him leave the program. Patient then was living with his father. Patient denies using ETOH.  Patient reports cigarette use of 1/2 pack per day. No skin issues noted.   Paperwork completed. Personal belongings secured in locker #58. Oriented to unit. Q 15 minute checks initiated

## 2016-09-16 NOTE — ED Provider Notes (Signed)
-----------------------------------------   7:01 AM on 09/16/2016 -----------------------------------------   Blood pressure 107/66, pulse 63, temperature 97.7 F (36.5 C), temperature source Oral, resp. rate 16, height 5\' 10"  (1.778 m), weight 155 lb (70.3 kg), SpO2 100 %.  The patient had no acute events since last update.  Calm and cooperative at this time.  Disposition is pending Psychiatry/Behavioral Medicine team recommendations.     Sharman CheekPhillip Lavender Stanke, MD 09/16/16 [phone]

## 2016-09-16 NOTE — [facility] Group Notes (Signed)
[facility] Group Notes:  (Nursing/MHT/Case Management/Adjunct)  Date:  09/16/2016  Time:  3:43 PM  Type of Therapy:  Nurse Education  Participation Level:  Minimal  Participation Quality:  Inattentive and irritable  Affect:  Irritable  Cognitive:  Alert and Oriented  Insight:  Limited  Engagement in Group:  Lacking and None  Modes of Intervention:  Discussion and Education  Summary of Progress/Problems: Group focused on 'Different Perspectives' with didactic education and activity. Patient sat slumped in chair, non participative.   Vinetta BergamoBarbara M Shonita Rinck 09/16/2016, 3:43 PM
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
## 2016-09-16 NOTE — Progress Notes (Signed)
Patient attended AA group meeting.  

## 2016-09-16 NOTE — [facility] Suicide Risk Assessment (Signed)
[facility]   Nursing information obtained from:    Demographic factors:    Current Mental Status:    Loss Factors:    Historical Factors:    Risk Reduction Factors:     Total Time spent with patient: 20 minutes Principal Problem: MDD (major depressive disorder), recurrent severe, without psychosis (HCC) Diagnosis:   Patient Active Problem List   Diagnosis Date Noted  . Cocaine use disorder, severe, dependence (HCC) [F14.20] 09/16/2016  . Tobacco use disorder [F17.200] 09/16/2016  . MDD (major depressive disorder), recurrent severe, without psychosis (HCC) [F33.2] 09/16/2016  . Substance-induced anxiety disorder [facility](HCC) [F19.980] 09/16/2016   Subjective Data: Please see H&P.   Continued Clinical Symptoms:    The "Alcohol Use Disorders Identification Test", Guidelines for Use in Primary Care, Second Edition.  World Science writerHealth Organization [facility](WHO). Score between 0-7:  no or low risk or alcohol related problems. Score between 8-15:  moderate risk of alcohol related problems. Score between 16-19:  high risk of alcohol related problems. Score 20 or above:  warrants further diagnostic evaluation for alcohol dependence and treatment.   CLINICAL FACTORS:   Alcohol/Substance Abuse/Dependencies Previous Psychiatric Diagnoses and Treatments   Musculoskeletal: Strength & Muscle Tone: within normal limits Gait & Station: normal Patient leans: N/A  Psychiatric Specialty Exam: Physical Exam  ROS  Blood pressure (!) 97/56, pulse 65, temperature 97.6 F (36.4 C), temperature source Oral, resp. rate 16, height 5\' 10"  (1.778 m), weight 71.7 kg (158 lb), SpO2 100 %.Body mass index is 22.67 kg/m.   Please see H&P for mse   COGNITIVE FEATURES THAT CONTRIBUTE TO RISK:  Closed-mindedness, Polarized thinking and Thought constriction (tunnel vision)    SUICIDE RISK:   Moderate:  Frequent suicidal ideation with limited intensity, and duration, some specificity in terms  of plans, no associated intent, good self-control, limited dysphoria/symptomatology, some risk factors present, and identifiable protective factors, including available and accessible social support.  PLAN OF CARE: Please see H&P.   I certify that inpatient services furnished can reasonably be expected to improve the patient's condition.   Meriel Kelliher, MD 09/16/2016, 2:45 PM

## 2016-09-16 NOTE — ED Notes (Signed)
Pt transported to [facility] [place] under ivc pt cooperative

## 2016-09-16 NOTE — [facility] Group Notes (Signed)
Clinical Social Work Group Note  Pt came to group after arriving in the hospital and orienting to the unit.  He did not talk during group, but did listen to other people sharing.  His affect was blunted and depressed throughout group.  Ambrose MantleMareida Grossman-Orr, LCSW 09/16/2016, 4:55 PM

## 2016-09-17 LAB — TSH: TSH: 2.322 u[IU]/mL (ref 0.350–4.500)

## 2016-09-17 NOTE — Progress Notes (Signed)
D   Pt spent most of the shift in his room but did attend AA   He did not require medications from this RN this evening    He is calm and cooperative but can be irritable and is not forthcoming with information A   Verbal support given   Q 15 min checks  Medications administered and effectiveness monitored R   Pt safe at present and minimally receptive to verbal support

## 2016-09-17 NOTE — Progress Notes (Signed)
D: Patient has been isolative to self, room however is now presently in group. Spoke with patient 1:1 though he forwards minimal information. Guarded, irritable. Rates sleep as fair, appetite as good, energy as normal and concentration as good. Patient's affect flat, mood depressed and irritable. Appears distracted at times. Per night shift, patient disruptive this AM with peers. Rating depression, hopelessness and anxiety at a 6/10. States goal for today is to "find treatment options, talk to counselor." Denies pain, physical problems.   A: Medicated per orders, no prns given or requested. Emotional support offered and self inventory reviewed. Encouraged completion of Suicide Safety Plan. Discussed POC with MD, SW.    R: Patient verbalizes understanding of POC. Patient denies SI/HI and remains safe on level III obs. Plan is to move patient to [address] hall. Awaiting available bed.

## 2016-09-17 NOTE — Tx Team (Signed)
Interdisciplinary Treatment and Diagnostic Plan Update 09/17/2016 Time of Session: 9:30am  Russell Whitehead  MRN: [id number]  Principal Diagnosis: MDD (major depressive disorder), recurrent severe, without psychosis ([place])  Secondary Diagnoses: Principal Problem:   MDD (major depressive disorder), recurrent severe, without psychosis ([place]) Active Problems:   Cocaine use disorder, severe, dependence ([place])   Tobacco use disorder   Substance-induced anxiety disorder ([place])   Current Medications:  Current Facility-Administered Medications  Medication Dose Route Frequency Provider Last Rate Last Dose  . acetaminophen (TYLENOL) tablet 650 mg  650 mg Oral Q6H PRN Benjamine Mola, FNP      . alum & mag hydroxide-simeth (MAALOX/MYLANTA) 200-200-20 MG/5ML suspension 30 mL  30 mL Oral Q4H PRN Benjamine Mola, FNP      . hydrOXYzine (ATARAX/VISTARIL) tablet 25 mg  25 mg Oral TID PRN Benjamine Mola, FNP      . magnesium hydroxide (MILK OF MAGNESIA) suspension 30 mL  30 mL Oral Daily PRN Benjamine Mola, FNP      . OLANZapine zydis (ZYPREXA) disintegrating tablet 5 mg  5 mg Oral TID PRN Ursula Alert, MD      . sertraline (ZOLOFT) tablet 25 mg  25 mg Oral Daily Ursula Alert, MD   25 mg at 09/17/16 0802  . traZODone (DESYREL) tablet 50 mg  50 mg Oral QHS Ursula Alert, MD        PTA Medications: Prescriptions Prior to Admission  Medication Sig Dispense Refill Last Dose  . ibuprofen (ADVIL,MOTRIN) 800 MG tablet Take 1 tablet (800 mg total) by mouth 3 (three) times daily. (Patient not taking: Reported on 09/14/2016) 30 tablet 0 Completed Course at Unknown time  . traMADol (ULTRAM) 50 MG tablet Take 1 tablet (50 mg total) by mouth every 6 (six) hours as needed. (Patient not taking: Reported on 09/14/2016) 10 tablet 0 Completed Course at Unknown time    Treatment Modalities: Medication Management, Group therapy, Case management,  1 to 1 session with clinician, Psychoeducation, Recreational  therapy.  Patient Stressors: Financial difficulties Substance abuse Patient Strengths: Motivation for treatment/growth  Physician Treatment Plan for Primary Diagnosis: MDD (major depressive disorder), recurrent severe, without psychosis ([place]) Long Term Goal(s): Improvement in symptoms so as ready for discharge Short Term Goals: Compliance with prescribed medications will improve Ability to identify triggers associated with substance abuse/mental health issues will improve Compliance with prescribed medications will improve Ability to identify triggers associated with substance abuse/mental health issues will improve  Medication Management: Evaluate patient's response, side effects, and tolerance of medication regimen.  Therapeutic Interventions: 1 to 1 sessions, Unit Group sessions and Medication administration.  Evaluation of Outcomes: Not Met  Physician Treatment Plan for Secondary Diagnosis: Principal Problem:   MDD (major depressive disorder), recurrent severe, without psychosis ([place]) Active Problems:   Cocaine use disorder, severe, dependence ([place])   Tobacco use disorder   Substance-induced anxiety disorder ([place])  Long Term Goal(s): Improvement in symptoms so as ready for discharge  Short Term Goals: Compliance with prescribed medications will improve Ability to identify triggers associated with substance abuse/mental health issues will improve Compliance with prescribed medications will improve Ability to identify triggers associated with substance abuse/mental health issues will improve  Medication Management: Evaluate patient's response, side effects, and tolerance of medication regimen.  Therapeutic Interventions: 1 to 1 sessions, Unit Group sessions and Medication administration.  Evaluation of Outcomes: Not Met  RN Treatment Plan for Primary Diagnosis: MDD (major depressive disorder), recurrent severe, without psychosis ([place]) Long Term Goal(s):  Knowledge of disease  and therapeutic regimen to maintain health will improve  Short Term Goals: Ability to verbalize feelings will improve, Ability to disclose and discuss suicidal ideas and Compliance with prescribed medications will improve  Medication Management: RN will administer medications as ordered by provider, will assess and evaluate patient's response and provide education to patient for prescribed medication. RN will report any adverse and/or side effects to prescribing provider.  Therapeutic Interventions: 1 on 1 counseling sessions, Psychoeducation, Medication administration, Evaluate responses to treatment, Monitor vital signs and CBGs as ordered, Perform/monitor CIWA, COWS, AIMS and Fall Risk screenings as ordered, Perform wound care treatments as ordered.  Evaluation of Outcomes: Not Met  LCSW Treatment Plan for Primary Diagnosis: MDD (major depressive disorder), recurrent severe, without psychosis ([place]) Long Term Goal(s): Safe transition to appropriate next level of care at discharge, Engage patient in therapeutic group addressing interpersonal concerns. Short Term Goals: Engage patient in aftercare planning with referrals and resources, Increase ability to appropriately verbalize feelings, Facilitate patient progression through stages of change regarding substance use diagnoses and concerns, Identify triggers associated with mental health/substance abuse issues and Increase skills for wellness and recovery  Therapeutic Interventions: Assess for all discharge needs, 1 to 1 time with Social worker, Explore available resources and support systems, Assess for adequacy in community support network, Educate family and significant other(s) on suicide prevention, Complete Psychosocial Assessment, Interpersonal group therapy.  Evaluation of Outcomes: Not Met  Progress in Treatment: Attending groups: Pt is new to milieu, continuing to assess  Participating in groups: Pt is new to milieu, continuing to  assess  Taking medication as prescribed: Yes, MD continues to assess for medication changes as needed Toleration medication: Yes, no side effects reported at this time Family/Significant other contact made: No, CSW assessing for appropriate contact Patient understands diagnosis: Continuing to assess Discussing patient identified problems/goals with staff: Yes Medical problems stabilized or resolved: Yes Denies suicidal/homicidal ideation:  Issues/concerns per patient self-inventory: None Other: N/A  New problem(s) identified: None identified at this time.   New Short Term/Long Term Goal(s): None identified at this time.   Discharge Plan or Barriers: Pt will return home and follow up outpatient with [facility] or inpatient with Su[facility].  Reason for Continuation of Hospitalization:  Depression Medication stabilization Suicidal ideation Withdrawal symptoms  Estimated Length of Stay: 3-5 days  Attendees: Patient: 09/17/2016 11:41 AM  Physician: Dr. Modesta Messing 09/17/2016 11:41 AM  Nursing: Gerald Dexter, RN 09/17/2016 11:41 AM  RN Care Manager: Lars Pinks, RN 09/17/2016 11:41 AM  Social Worker: Maxie Better, LCSW; Matthew Saras, [place] 09/17/2016 11:41 AM  Recreational Therapist:  09/17/2016 11:41 AM  Other: Lindell Spar, NP; Samuel Jester, NP 09/17/2016 11:41 AM  Other:  09/17/2016 11:41 AM  Other: 09/17/2016 11:41 AM   Scribe for Treatment Team: Georga Kaufmann, MSW,LCSWA 09/17/2016 11:41 AM

## 2016-09-17 NOTE — Progress Notes (Signed)
D   Pt spent most of the shift in his room but did attend AA   He did not require medications from this RN this evening    He is calm and cooperative A   Verbal support given   Q 15 min checks R   Pt safe at present
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
## 2016-09-17 NOTE — [facility] Group Notes (Signed)
[facility] LCSW Group Therapy  09/17/2016 1:15pm  Type of Therapy: Group Therapy   Topic: Overcoming Obstacles  Participation Level: Pt invited. Did not attend.
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
## 2016-09-17 NOTE — Plan of Care (Signed)
Problem: Safety: Goal: Periods of time without injury will increase Outcome: Progressing Patient has not engaged in self harm, denies SI.   Problem: Medication: Goal: Compliance with prescribed medication regimen will improve Outcome: Progressing Patient was med compliant this AM.

## 2016-09-17 NOTE — Progress Notes (Signed)
Recreation Therapy Notes  Date: 09/17/16 Time: 0930 Location: 300 Hall Dayroom  Group Topic: Stress Management  Goal Area(s) Addresses:  Patient will verbalize importance of using healthy stress management.  Patient will identify positive emotions associated with healthy stress management.   Intervention: Stress Management  Activity :  Letting Go Meditation.  LRT introduced the stress management technique of meditation.  LRT played a meditation from the Calm app focused on letting go.  Patients were to follow along as meditation played to engage in the technique.  Education:  Stress Management, Discharge Planning.   Education Outcome: Acknowledges edcuation/In group clarification offered/Needs additional education  Clinical Observations/Feedback: Pt did not attend group.   Caroll RancherMarjette Tanikka Bresnan, LRT/CTRS         Caroll RancherLindsay, Sherrica Niehaus A 09/17/2016 12:35 PM

## 2016-09-18 MED ORDER — SERTRALINE HCL 50 MG PO TABS
50.0000 mg | ORAL_TABLET | Freq: Every day | ORAL | Status: DC
  Administered 2016-09-19 – 2016-09-21 (×3): 50 mg via ORAL
  Filled 2016-09-18 (×3): qty 1
  Filled 2016-09-18 (×2): qty 7
  Filled 2016-09-18: qty 1

## 2016-09-18 NOTE — Plan of Care (Signed)
Problem: Coping: Goal: Ability to verbalize feelings will improve Outcome: Progressing Pt discussed his feelings about his addiction problems in 1:1 with nurse  Problem: Safety: Goal: Ability to remain free from injury will improve Outcome: Progressing Pt is free from injury

## 2016-09-18 NOTE — Progress Notes (Signed)
Per pt request, CSW faxed referral to [facility], via fax number [phone], for residential treatment.  Jonathon JordanLynn B Melchizedek Espinola, MSW, Theresia MajorsLCSWA [phone]

## 2016-09-18 NOTE — Progress Notes (Signed)
D   Pt has been out on the milieu more this evening   His behavior is appropriate and he has been supportive of his peers   He did not want to take medications for sleep as he said he doesn't have trouble falling asleep  He said his problem is he has put too many chemicals in his body and wants to change that   A   Verbal support given   Medications administered and effectiveness monitored   Q 15 min checks  R   Pt is safe at present

## 2016-09-18 NOTE — Plan of Care (Signed)
Problem: Safety: Goal: Periods of time without injury will increase Outcome: Progressing Pt denies thoughts of self harm and contracts for safety.

## 2016-09-18 NOTE — Progress Notes (Signed)
D: Russell Whitehead said his medication made him sleepy, which is why he missed a visit from the social worker this morning. He was upset that he hadn't been awakened to speak with the Child psychotherapistsocial worker. He denied SI, HI, and AVH. He appeared irritable but as cooperative and compliant with medications. He has been present in the milieu but interacting little. No behavioral issues noted. On his self inventory form, he reported fair sleep, good appetite, normal energy level, and good concentration. He rated his depression, feelings of hopelessness, and anxiety all at 6/10.   A: Meds given as ordered. Q15 safety checks maintained. Support/encouragement offered.  R: Pt remains free from harm and continues with treatment. Will continue to monitor for needs/safety.
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
## 2016-09-19 DIAGNOSIS — F1420 Cocaine dependence, uncomplicated: Secondary | ICD-10-CM

## 2016-09-19 DIAGNOSIS — F19980 Other psychoactive substance use, unspecified with psychoactive substance-induced anxiety disorder: Secondary | ICD-10-CM

## 2016-09-19 DIAGNOSIS — F17210 Nicotine dependence, cigarettes, uncomplicated: Secondary | ICD-10-CM

## 2016-09-19 NOTE — Progress Notes (Signed)
Recreation Therapy Notes  Date: 09/19/16 Time: 0930 Location: 300 Hall Dayroom  Group Topic: Stress Management  Goal Area(s) Addresses:  Patient will verbalize importance of using healthy stress management.  Patient will identify positive emotions associated with healthy stress management.   Behavioral Response: Engaged  Intervention: Stress Management  Activity :  Guided Imagery.  LRT introduced the stress management technique of guided imagery.  LRT read a script to allow patients to go one a mental vacation.  Patients were to follow along as LRT read script to engage in the activity.  Education:  Stress Management, Discharge Planning.   Education Outcome: Acknowledges edcuation/In group clarification offered/Needs additional education  Clinical Observations/Feedback: Pt attended group.   Caroll RancherMarjette Talyn Eddie, LRT/CTRS     Caroll RancherLindsay, Troye Hiemstra A 09/19/2016 12:59 PM

## 2016-09-19 NOTE — BHH Group Notes (Signed)
B[facility] LCSW Group Therapy  09/19/2016 1:15pm  Type of Therapy: Group Therapy   Topic: Overcoming Obstacles  Participation Level: Active  Participation Quality: Appropriate   Affect: Appropriate  Cognitive: Appropriate and Oriented  Insight: Developing/Improving and Improving  Engagement in Therapy: Improving  Modes of Intervention: Discussion, Exploration, Problem-solving and Support  Description of Group:  In this group patients will be encouraged to explore what they see as obstacles to their own wellness and recovery. They will be guided to discuss their thoughts, feelings, and behaviors related to these obstacles. The group will process together ways to cope with barriers, with attention given to specific choices patients can make. Each patient will be challenged to identify changes they are motivated to make in order to overcome their obstacles. This group will be process-oriented, with patients participating in exploration of their own experiences as well as giving and receiving support and challenge from other group members.  Summary of Patient Progress:  Pt stated that the main obstacle he is currently facing is himself. Pt states that he is focused on recovery and would really like to get himself some help. Pt is looking forward to what comes of [facility] but is becoming impatient with the process.  Therapeutic Modalities:  Cognitive Behavioral Therapy Solution Focused Therapy Motivational Interviewing Relapse Prevention Therapy  Jonathon JordanLynn B Lem Peary, MSW, Theresia MajorsLCSWA [phone]

## 2016-09-19 NOTE — Progress Notes (Signed)
Pt said why come to group. It is a waste of his time. Pt came to group 20:30

## 2016-09-19 NOTE — Progress Notes (Signed)
Patient ID: Russell Whitehead, male   DOB: 07-11-1975, 41 y.o.   MRN: [id number] EKG was performed and results shown to MD I. And placed on patient's physical chart.

## 2016-09-19 NOTE — Progress Notes (Signed)
[facility] Group Notes:  (Nursing/MHT/Case Management/Adjunct)  Date:  09/19/2016  Time:  11:02 AM  Type of Therapy:  Nurse Education  Participation Level:  Active  Participation Quality:  Lacking  Affect:  Anxious, depressed  Cognitive:  Alert and Oriented  Insight:  Lacking  Engagement in Group:  Lacking  Modes of Intervention:  Activity, Discussion, Education, Socialization and Support  Summary of Progress/Problems:Pt was irritable, blaming others and complaining during group. Pt did not participate in aromatherapy.     Beatrix ShipperWright, Deysy Schabel Martin 09/19/2016, 11:02 AM

## 2016-09-19 NOTE — Progress Notes (Signed)
Patient ID: Russell MuslimBruce Gregory Whitehead, male   DOB: 1976/06/16, [age] y.o.   MRN: [id number]  DAR: Pt. Denies SI/HI and A/V Hallucinations. He reports sleep is fair, appetite is good, energy level is normal, and concentration is good. He rates depression 5/10, hopelessness 3/10, and anxiety 3/10.  Patient does not report any pain or discomfort at this time. Support and encouragement provided to the patient however patient is minimal and guarded. Scheduled medication administered to patient per physician's orders. Patient is seen in the milieu minimally. Q15 minute checks are maintained for safety.

## 2016-09-19 NOTE — Progress Notes (Signed)
D: Patient pleasant and cooperative with staff, but does endorse depression with a dull, flat affect. Patient states "I'm just biding my time until I can get to a long-term treatment facility. I need to get away from my environment so I can recover". A: Encourage staff/peer interaction, medication compliance, and group participation. Administer medications as ordered, maintain Q 15 minute safety checks. R: Pt attended group session. Pt denies SI at this time and verbally contracts for safety. No signs/symptoms of distress noted.

## 2016-09-19 NOTE — Progress Notes (Signed)
[facility] MD Progress Note  09/19/2016 2:53 PM Russell Whitehead  MRN:  [id number] Subjective:  [name], lives with his parents.  Presented  to the ER intoxicated with cocaine. Expressed suicidal thoughts and marked anger/irritability.   Seen today. Patient is tolerating recent medication adjustment well. Says he called [facility] and informed that there is no bed yet. Patient says he hopes to get into residential treatment from here. States that he does not want to get back into the same things that complicates his life. No suicidal thoughts. No homicidal thoughts. No thoughts of violence. No evidence of psychosis. No evidence of mania. Patient is future oriented.  Had ECG today. Patient is not sure why it was ordered. No chest pain. No palpitations.  No dyspnea, no pedal swelling.   Nursing staff reports he has been more interactive. He has been participating with unit groups and activities. No behavioral issues.   Principal Problem: MDD (major depressive disorder), recurrent severe, without psychosis (HCC) Diagnosis:   Patient Active Problem List   Diagnosis Date Noted  . Cocaine use disorder, severe, dependence (HCC) [F14.20] 09/16/2016  . Tobacco use disorder [F17.200] 09/16/2016  . MDD (major depressive disorder), recurrent severe, without psychosis (HCC) [F33.2] 09/16/2016  . 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 No pertinent past medical history.  Past Surgical History:  Procedure Laterality Date  . HERNIA REPAIR     Family History:  Family History  Problem Relation Age of Onset  . Diabetes Mother   . Depression Father    Family Psychiatric  History: see HPI Social History:  History  Alcohol Use No     History  Drug Use  . Types: Cocaine, "Crack" cocaine    Social History   Social History  . Marital status: Single     Spouse name: N/A  . Number of children: N/A  . Years of education: N/A   Social History Main Topics  . Smoking status: Current Every Day Smoker    Packs/day: 0.50    Types: Cigarettes  . Smokeless tobacco: Never Used  . Alcohol use No  . Drug use: Yes    Types: Cocaine, "Crack" cocaine  . Sexual activity: Not Asked   Other Topics Concern  . None   Social History Narrative  . None   Additional Social History:       Sleep: Good  Appetite:  Good  Current Medications: Current Facility-Administered Medications  Medication Dose Route Frequency Provider Last Rate Last Dose  . acetaminophen (TYLENOL) tablet 650 mg  650 mg Oral Q6H PRN Beau Fanny, FNP      . alum & mag hydroxide-simeth (MAALOX/MYLANTA) 200-200-20 MG/5ML suspension 30 mL  30 mL Oral Q4H PRN Beau Fanny, FNP      . hydrOXYzine (ATARAX/VISTARIL) tablet 25 mg  25 mg Oral TID PRN Beau Fanny, FNP      . magnesium hydroxide (MILK OF MAGNESIA) suspension 30 mL  30 mL Oral Daily PRN Beau Fanny, FNP      . OLANZapine zydis (ZYPREXA) disintegrating tablet 5 mg  5 mg Oral TID PRN Jomarie Longs, MD      . sertraline (ZOLOFT) tablet 50 mg  50 mg Oral Daily Georgiann Cocker, MD   50 mg at 09/19/16 0816  . traZODone (DESYREL) tablet 50 mg  50 mg Oral QHS Saramma Eappen,  MD        Lab Results:  No results found for this or any previous visit (from the past 48 hour(s)).  Blood Alcohol level:  Lab Results  Component Value Date   ETH <5 09/14/2016    Metabolic Disorder Labs: No results found for: HGBA1C, MPG No results found for: PROLACTIN No results found for: CHOL, TRIG, HDL, CHOLHDL, VLDL, LDLCALC  Physical Findings: AIMS: Facial and Oral Movements Muscles of Facial Expression: None, normal Lips and Perioral Area: None, normal Jaw: None, normal Tongue: None, normal,Extremity Movements Upper (arms, wrists, hands, fingers): None, normal Lower (legs, knees, ankles, toes): None, normal, Trunk  Movements Neck, shoulders, hips: None, normal, Overall Severity Severity of abnormal movements (highest score from questions above): None, normal Incapacitation due to abnormal movements: None, normal Patient's awareness of abnormal movements (rate only patient's report): No Awareness, Dental Status Current problems with teeth and/or dentures?: No Does patient usually wear dentures?: No  CIWA:    COWS:     Musculoskeletal: Strength & Muscle Tone: within normal limits Gait & Station: normal Patient leans: N/A  Psychiatric Specialty Exam:   Physical Exam  Nursing note and vitals reviewed. Constitutional: He is oriented to person, place, and time. He appears well-developed and well-nourished.  HENT:  Head: Normocephalic and atraumatic.  Eyes: Conjunctivae and EOM are normal. Pupils are equal, round, and reactive to light.  Neck: Normal range of motion. Neck supple.  Cardiovascular: Normal rate, regular rhythm and normal heart sounds.   Respiratory: Effort normal and breath sounds normal.  GI: Soft. Bowel sounds are normal.  Neurological: He is alert and oriented to person, place, and time. He has normal reflexes.  Skin: Skin is warm and dry.  Psychiatric:  As above    ROS  Blood pressure 106/79, pulse 71, temperature 98.4 F (36.9 C), temperature source Oral, resp. rate 18, height 5\' 10"  (1.778 m), weight 71.7 kg (158 lb), SpO2 100 %.Body mass index is 22.67 kg/m.  General Appearance: Neatly dressed, engages well. Appropriate behavior. Calm and cooperative.   Eye Contact:  Good  Speech:  Spontaneous, not pressured.   Volume:  Soft   Mood: Feels better  Affect:  Restricted and mood congruent  Thought Process:  Logical and linear  Orientation:  Full (Time, Place, and Person)  Thought Content:  No negative rumination. No preoccupation with violent thoughts. No delusional theme. No hallucination in any modality  Suicidal Thoughts:  No  Homicidal Thoughts:  No  Memory:  WNL   Judgement:  Good  Insight:  Good  Psychomotor Activity:  Normal  Concentration: WNL  Recall:  Good  Fund of Knowledge:  Good  Language:  Good  Akathisia:  No  Handed:  Right  AIMS (if indicated):     Assets:  Resilience Social Support  ADL's:  Intact  Cognition:  WNL  Sleep:  Number of Hours: 6.75    Treatment Plan Summary: Patient is tolerating recent medication adjustment well. Mood is better. No dangerousness. He is still committed to inpatient rehab. We are awaiting placement.    Psychiatric: MDD Recurrent Cocaine use disorder  Medical: DM  Psychosocial:  Loss of relationships.  PLAN: 1. Continue current regimen 2. Continue to monitor mood, behavior and interaction with peers 3. Encourage group activation.   4. SW would coordinate aftercare     Georgiann Cocker, MD  09/19/2016, 2:53 PMPatient ID: Arlee Muslim, male   DOB: Dec 23, 1975, 41 y.o.   MRN: [id number] Patient ID: Smitty Cords  Gregory MoSharyon Medicusore, male   DOB: 09/02/1975, 41 y.o.   MRN: [id number]

## 2016-09-20 NOTE — Progress Notes (Signed)
D:Pt is guarded with a flat affect and minimal interaction. Pt does sit in the dayroom and wants long term treatment. He rates depression as a 4 and anxiety as a 3 on 0-10 scale with 10 being the most. A:Offered support, encouragement and 15 minute checks. R:Pt denies si and hi. Safety maintained on the unit.

## 2016-09-20 NOTE — Progress Notes (Signed)
[facility]  09/20/2016 3:19 PM Page [name] [name]  MRN:  [id number] Subjective:  [name], lives with his parents.  Presented  to the ER intoxicated with cocaine. Expressed suicidal thoughts and marked anger/irritability.   Seen today. States that he is feeling better. He is still committed to addiction inpatient treatment. Says he is not sure when a bed would become available. Patient states that he might have to return home with his family. No suicidal thoughts. No homicidal thoughts. No irritability.   Nursing staff reports he has been appropriate. No behavioral issues. He has maintained normal biological functions. He has not expressed any futility thoughts. He is focused on getting away from his home environment. Wants inpatient rehab.  Principal Problem: MDD (major depressive disorder), recurrent severe, without psychosis (HCC) Diagnosis:   Patient Active Problem List   Diagnosis Date Noted  . Cocaine use disorder, severe, dependence (HCC) [F14.20] 09/16/2016  . Tobacco use disorder [F17.200] 09/16/2016  . MDD (major depressive disorder), recurrent severe, without psychosis (HCC) [F33.2] 09/16/2016  . 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 No pertinent past medical history.  Past Surgical History:  Procedure Laterality Date  . HERNIA REPAIR     Family History:  Family History  Problem Relation Age of Onset  . Diabetes Mother   . Depression Father    Family Psychiatric  History: see HPI Social History:  History  Alcohol Use No     History  Drug Use  . Types: Cocaine, "Crack" cocaine    Social History   Social History  . Marital status: Single    Spouse name: N/A  . Number of children: N/A  . Years of education: N/A   Social History Main Topics  . Smoking status: Current Every Day Smoker    Packs/day:  0.50    Types: Cigarettes  . Smokeless tobacco: Never Used  . Alcohol use No  . Drug use: Yes    Types: Cocaine, "Crack" cocaine  . Sexual activity: Not Asked   Other Topics Concern  . None   Social History Narrative  . None   Additional Social History:       Sleep: Good  Appetite:  Good  Current Medications: Current Facility-Administered Medications  Medication Dose Route Frequency Provider Last Rate Last Dose  . acetaminophen (TYLENOL) tablet 650 mg  650 mg Oral Q6H PRN Beau FannyJohn C Withrow, FNP      . alum & mag hydroxide-simeth (MAALOX/MYLANTA) 200-200-20 MG/5ML suspension 30 mL  30 mL Oral Q4H PRN Beau FannyJohn C Withrow, FNP      . hydrOXYzine (ATARAX/VISTARIL) tablet 25 mg  25 mg Oral TID PRN Beau FannyJohn C Withrow, FNP      . magnesium hydroxide (MILK OF MAGNESIA) suspension 30 mL  30 mL Oral Daily PRN Beau FannyJohn C Withrow, FNP      . OLANZapine zydis (ZYPREXA) disintegrating tablet 5 mg  5 mg Oral TID PRN Jomarie LongsSaramma Eappen, MD      . sertraline (ZOLOFT) tablet 50 mg  50 mg Oral Daily Georgiann CockerVincent A Jerrin Recore, MD   50 mg at 09/20/16 0839  . traZODone (DESYREL) tablet 50 mg  50 mg Oral QHS Jomarie LongsSaramma Eappen, MD        Lab Results:  No results found for this or any previous visit (from the past 48 hour(s)).  Blood Alcohol level:  Lab Results  Component Value Date   ETH <5 09/14/2016    Metabolic Disorder Labs: No results found for: HGBA1C, MPG No results found for: PROLACTIN No results found for: CHOL, TRIG, HDL, CHOLHDL, VLDL, LDLCALC  Physical Findings: AIMS: Facial and Oral Movements Muscles of Facial Expression: None, normal Lips and Perioral Area: None, normal Jaw: None, normal Tongue: None, normal,Extremity Movements Upper (arms, wrists, hands, fingers): None, normal Lower (legs, knees, ankles, toes): None, normal, Trunk Movements Neck, shoulders, hips: None, normal, Overall Severity Severity of abnormal movements (highest score from questions above): None, normal Incapacitation due to  abnormal movements: None, normal Patient's awareness of abnormal movements (rate only patient's report): No Awareness, Dental Status Current problems with teeth and/or dentures?: No Does patient usually wear dentures?: No  CIWA:    COWS:     Musculoskeletal: Strength & Muscle Tone: within normal limits Gait & Station: normal Patient leans: N/A  Psychiatric Specialty Exam:   Physical Exam  Nursing note and vitals reviewed. Constitutional: He is oriented to person, place, and time. He appears well-developed and well-nourished.  HENT:  Head: Normocephalic and atraumatic.  Eyes: Conjunctivae and EOM are normal. Pupils are equal, round, and reactive to light.  Neck: Normal range of motion. Neck supple.  Cardiovascular: Normal rate, regular rhythm and normal heart sounds.   Respiratory: Effort normal and breath sounds normal.  GI: Soft. Bowel sounds are normal.  Neurological: He is alert and oriented to person, place, and time. He has normal reflexes.  Skin: Skin is warm and dry.  Psychiatric:  As above    ROS  Blood pressure 112/69, pulse (!) 54, temperature 97.8 F (36.6 C), temperature source Oral, resp. rate 18, height 5\' 10"  (1.778 m), weight 71.7 kg (158 lb), SpO2 100 %.Body mass index is 22.67 kg/m.  General Appearance: Neatly dressed, pleasant, engaging well and cooperative. Appropriate behavior. Not in any distress. Good relatedness. Not internally stimulated  Eye Contact:  Good  Speech:  Spontaneous, not pressured.   Volume:  Normal   Mood: Euthymic   Affect:  Restricted but appropriate  Thought Process:  Logical and linear  Orientation:  Full (Time, Place, and Person)  Thought Content:  No negative rumination. No preoccupation with violent thoughts. No delusional theme. No hallucination in any modality  Suicidal Thoughts:  No  Homicidal Thoughts:  No  Memory:  WNL  Judgement:  Good  Insight:  Good  Psychomotor Activity:  Normal  Concentration: WNL  Recall:  Good   Fund of Knowledge:  Good  Language:  Good  Akathisia:  No  Handed:  Right  AIMS (if indicated):     Assets:  Resilience Social Support  ADL's:  Intact  Cognition:  WNL  Sleep:  Number of Hours: 6.5    Treatment Plan Summary: Patient is not a danger to themselves or others. Patient is not depressed or manic. We are finalizing aftercare   Psychiatric: MDD Recurrent Cocaine use disorder  Medical: DM  Psychosocial:  Loss of relationships.  PLAN: 1. Continue current regimen 2. Continue to monitor mood, behavior and interaction with peers 3. Likely discharge this weekend.      Georgiann Cocker, MD  09/20/2016, 3:19 PMPatient ID: Russell Whitehead, male   DOB: 1976-04-13, 41 y.o.   MRN: [id number] Patient ID: Candace Ramus, male   DOB: [birth year], 41 y.o.   MRN: [id number] Patient ID: Chistian Kasler, male   DOB: 1976/03/28, 41 y.o.   MRN: [id number]

## 2016-09-20 NOTE — [facility] Group Notes (Signed)
[facility] LCSW Group Therapy 09/20/2016 1:15pm  Type of Therapy: Group Therapy- Balance in Life  Participation Level: Active   Description of the Group:  The topic for group was balance in life. Today's group focused on defining balance in one's own words, identifying things that can knock one off balance, and exploring healthy ways to maintain balance in life. Group members were asked to provide an example of a time when they felt off balance, describe how they handled that situation,and process healthier ways to regain balance in the future. Group members were asked to share the most important tool for maintaining balance that [facility] and how they plan to apply this method after discharge.  Summary of Patient Progress  Pt states that his life is unbalanced at the moment. Pt has had a hard time getting his life back on track after the break up with his girlfriend and his recent relapse. Pt was able to process the events that occurred the night he relapsed and was able to identify the reason he relapsed. This seemed to be important to him. Pt is thinking about discharging home and waiting for an [facility] bed from home.   Therapeutic Modalities:   Cognitive Behavioral Therapy Solution-Focused Therapy Assertiveness Training   Jonathon JordanLynn B Mylo Choi, MSW, [place] [phone] 09/20/2016 4:41 PM

## 2016-09-21 MED ORDER — HYDROXYZINE HCL 25 MG PO TABS: 25.0000 mg | ORAL_TABLET | Freq: Three times a day (TID) | ORAL | 0 refills | Status: DC | PRN

## 2016-09-21 MED ORDER — SERTRALINE HCL 50 MG PO TABS: 50.0000 mg | ORAL_TABLET | Freq: Every day | ORAL | 0 refills | Status: DC

## 2016-09-21 MED ORDER — OLANZAPINE 5 MG PO TBDP: 5.0000 mg | ORAL_TABLET | Freq: Three times a day (TID) | ORAL | 0 refills | Status: DC | PRN

## 2016-09-21 MED ORDER — TRAZODONE HCL 50 MG PO TABS: 50.0000 mg | ORAL_TABLET | Freq: Every day | ORAL | 0 refills | Status: DC

## 2016-09-21 NOTE — Progress Notes (Signed)
Recreation Therapy Notes  Date: 09/21/16 Time: 0930 Location: 300 [address] Dayroom  Group Topic: Stress Management  Goal Area(s) Addresses:  Patient will verbalize importance of using healthy stress management.  Patient will identify positive emotions associated with healthy stress management.   Behavioral Response: Engaged  Intervention: Stress Management  Activity :  Meditation.  LRT introduced the stress management technique of meditation to patients.  LRT played a meditation from the Calm app to allow patients to engage in the technique.  Patients were to follow along as the meditation played to engage.      Education:  Stress Management, Discharge Planning.   Education Outcome: Acknowledges edcuation/In group clarification offered/Needs additional education  Clinical Observations/Feedback: Pt attended group.   Caroll RancherMarjette Renly Roots, LRT/CTRS         Caroll RancherLindsay, Keilani Terrance A 09/21/2016 12:20 PM

## 2016-09-21 NOTE — Progress Notes (Signed)
Data. Patient denies SI/HI/AVH. Patient interacting well with staff and other patients. Affect continues to be sullen, but eye contact has improved and will hold eye contact for a short space of time. On his self assessment he reports 4/10 for depression, 2/10 for hopelessness and 3/10 for anxiety.  His goal for today is, "Treatment plan and release." Action. Emotional support and encouragement offered. Education provided on medication, indications and side effect. Q 15 minute checks done for safety. Response. Safety on the unit maintained through 15 minute checks.  Medications taken as prescribed. Attended groups. Remained calm and appropriate through out shift.  Pt. discharged to lobby.  Belongings sheet reviewed and signed by pt. and all belongings sent home, including scripts and sample medications. Paperwork reviewed and pt. able to verbalize understanding of education. Pt. in no current distress and ambulatory.

## 2016-09-21 NOTE — [facility] Suicide Risk Assessment (Signed)
[facility] INPATIENT:  Family/Significant Other Suicide Prevention Education  Suicide Prevention Education:  Contact Attempts: Rosalyn GessLessie Thompson [phone]), has been identified by the patient as the family member/significant other with whom the patient will be residing, and identified as the person(s) who will aid the patient in the event of a mental health crisis.  With written consent from the patient, two attempts were made to provide suicide prevention education, prior to and/or following the patient's discharge.  We were unsuccessful in providing suicide prevention education.  A suicide education pamphlet was given to the patient to share with family/significant other.  Date and time of first attempt: 09/21/16 @9 :50am No answer, left a message.   Jonathon JordanLynn B Chevon Whitehead 09/21/2016, 9:50 AM

## 2016-09-21 NOTE — [facility] Suicide Risk Assessment (Signed)
[facility] INPATIENT:  Family/Significant Other Suicide Prevention Education  Suicide Prevention Education:  Education Completed; Russell Whitehead (mom [phone]), has been identified by the patient as the family member/significant other with whom the patient will be residing, and identified as the person(s) who will aid the patient in the event of a mental health crisis (suicidal ideations/suicide attempt).  With written consent from the patient, the family member/significant other has been provided the following suicide prevention education, prior to the and/or following the discharge of the patient.  The suicide prevention education provided includes the following:  Suicide risk factors  Suicide prevention and interventions  National Suicide Hotline telephone number  [facility] assessment telephone number  [facility] Emergency Assistance 911  [facility] and/or Residential Mobile Crisis Unit telephone number  Request made of family/significant other to:  Remove weapons (e.g., guns, rifles, knives), all items previously/currently identified as safety concern.    Remove drugs/medications (over-the-counter, prescriptions, illicit drugs), all items previously/currently identified as a safety concern.  The family member/significant other verbalizes understanding of the suicide prevention education information provided.  The family member/significant other agrees to remove the items of safety concern listed above.  Russell Whitehead 09/21/2016, 1:14 PM

## 2016-09-21 NOTE — Progress Notes (Signed)
  [facility] :  Will you be returning to the same living situation after discharge:  Yes,  pt returning home. At discharge, do you have transportation home?: Yes,  bus passes provided. Do you have the ability to pay for your medications: Yes,  samples and prescriptions provided.  Release of information consent forms completed and in the chart;  Patient's signature needed at discharge.  Patient to Follow up at: Follow-up Information    [facility] Follow up.   Why:  A referral has been made on your behalf for residential treatment. Please call and continue to check for bed availbility. Drema PryShayla is the contact person. Thank you. Contact information: [address] [place], [place] [address] P: [phone] F: [phone]       [facility] Follow up.   Why:  Please go for a walk-in appointment within 1-3 days of discharge to be established for oupatient services. Walk-in hours are Mon-Fri 8a-3p. Please arrive as early as possible to be sure you are seen. Contact information: [address] [place] [address] [phone]           Next level of care provider has access to [facility] Link:no  Safety Planning and Suicide Prevention discussed: Yes,  with pt and with pt's mother.  Have you used any form of tobacco in the last 30 days? (Cigarettes, Smokeless Tobacco, Cigars, and/or Pipes): Yes  Has patient been referred to the Quitline?: Patient refused referral  Patient has been referred for addiction treatment: Yes  Jonathon JordanLynn B Harla Mensch 09/21/2016, 11:20 AM
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
## 2016-09-21 NOTE — [facility] Suicide Risk Assessment (Signed)
[facility] Discharge Suicide Risk Assessment   Principal Problem: MDD (major depressive disorder), recurrent severe, without psychosis (HCC) Discharge Diagnoses:  Patient Active Problem List   Diagnosis Date Noted  . Cocaine use disorder, severe, dependence (HCC) [F14.20] 09/16/2016  . Tobacco use disorder [F17.200] 09/16/2016  . MDD (major depressive disorder), recurrent severe, without psychosis (HCC) [F33.2] 09/16/2016  . Substance-induced anxiety disorder [facility]) [F19.980] 09/16/2016    Total Time spent with patient: 30 minutes  Musculoskeletal: Strength & Muscle Tone: within normal limits Gait & Station: normal Patient leans: N/A  Psychiatric Specialty Exam: Review of Systems  Constitutional: Negative.   HENT: Negative.   Eyes: Negative.   Respiratory: Negative.   Cardiovascular: Negative.   Gastrointestinal: Negative.   Genitourinary: Negative.   Musculoskeletal: Negative.   Skin: Negative.   Neurological: Negative.   Endo/Heme/Allergies: Negative.   Psychiatric/Behavioral: Negative for depression, hallucinations, memory loss, substance abuse and suicidal ideas. The patient is not nervous/anxious and does not have insomnia.     Blood pressure 102/69, pulse (!) 55, temperature 97.9 F (36.6 C), temperature source Oral, resp. rate 18, height 5\' 10"  (1.778 m), weight 71.7 kg (158 lb), SpO2 100 %.Body mass index is 22.67 kg/m.  General Appearance: Neatly dressed, pleasant, engaging well and cooperative. Appropriate behavior. Not in any distress. Good relatedness. Not internally stimulated  Eye Contact::  Good  Speech:  Spontaneous, normal prosody. Normal tone and rate.   Volume:  Normal  Mood:  Euthymic  Affect:  Appropriate and Full Range  Thought Process:  Coherent and Linear  Orientation:  Full (Time, Place, and Person)  Thought Content:  No delusional theme. No preoccupation with violent thoughts. No negative ruminations. No obsession.  No hallucination in any modality.    Suicidal Thoughts:  No  Homicidal Thoughts:  No  Memory:  Immediate;   Good Recent;   Good Remote;   Good  Judgement:  Good  Insight:  Good  Psychomotor Activity:  Normal  Concentration:  Good  Recall:  Good  Fund of Knowledge:Good  Language: Good  Akathisia:  No  Handed:    AIMS (if indicated):     Assets:  Communication Skills Desire for Improvement Physical Health Resilience  Family support Has children  Sleep:  Number of Hours: 6.75  Cognition: WNL  ADL's:  Intact   Clinical Assessment::   Russell Whitehead single, lives with his parents.  Presented  to the ER intoxicated with cocaine. Expressed suicidal thoughts and marked anger/irritability at presentation.   Seen today. Patient has been able to link the role of substances in his mood swings. Notes that it damages his relationship to loved ones. He was upset with himself for relapsing again. Says he just wants to stay on track. He hoped to get into residential care from here. There is a possibility he might have to go back home before a bed becomes available. Patient is okay with that plan. Says his mood has stabilized. He is not depressed. No suicidal or homicidal thoughts. No thoughts of violence. No perceptual abnormalities. No delusional theme. No passivity of will. No craving for substances. No anxiety.  Nursing staff reports that patient has been appropriate on the unit. Patient has been interacting well with peers. No behavioral issues. Patient has not voiced any suicidal thoughts. Patient has not been observed to be internally stimulated. Patient has been adherent with treatment recommendations. Patient has been tolerating their medication well.   Patient was discussed at team. Team members feels that patient  is back to his baseline level of function. Team agrees with plan to discharge patient today.   Demographic Factors:  Male   Loss Factors: NA  Historical Factors: Impulsivity and substance use  Risk Reduction  Factors:   Sense of responsibility to family, Positive social support, Positive therapeutic relationship and Positive coping skills or problem solving skills  Continued Clinical Symptoms:   As above   Cognitive Features That Contribute To Risk:  None    Suicide Risk:  Minimal: No identifiable suicidal ideation.   Patient is not having any thoughts of suicide at this time. Modifiable risk factors targeted during this admission includes depression and substance use. Demographical and historical risk factors cannot be modified. Patient is now engaging well. Patient is reliable and is future oriented. We have buffered patient's support structures. At this point, patient is at low risk of suicide. Patient is aware of the effects of psychoactive substances on decision making process. Patient has been provided with emergency contacts. Patient acknowledges to use resources provided if unforseen circumstances changes their current risk stratification.     Plan Of Care/Follow-up recommendations:  1. Continue current psychotropic medications 2. Mental health and addiction follow up as arranged.     Russell Whitehead A Bernardine Langworthy, MD 09/21/2016, 9:52 AM

## 2016-09-21 NOTE — Progress Notes (Signed)
Adult Psychoeducational Group Note  Date:  09/21/2016 Time:  2:08 PM  Group Topic/Focus:  Recovery Goals:   The focus of this group is to identify appropriate goals for recovery and establish a plan to achieve them.  Participation Level:  Active  Participation Quality:  Appropriate  Affect:  Appropriate  Cognitive:  Alert  Insight: Good  Engagement in Group:  Developing/Improving  Modes of Intervention:  Education  Additional Comments:  none  Russell MccoyBrandon  Russell Whitehead 09/21/2016, 2:08 PM

## 2016-11-29 IMAGING — US US EXTREM NON VASC*L* COMPLETE
1 series · 14 of 17 positions shown · non-contrast
Comparison: None

CLINICAL DATA: Acute onset left inguinal mass

EXAM:
ULTRASOUND left LOWER EXTREMITY LIMITED
TECHNIQUE: Ultrasound examination of the lower extremity soft tissues was
performed in the area of clinical concern.

[Series 1: us extrem non vasc*left* complete · 0.06mm/px · 14 of 17 slices shown]
[im 1/17]
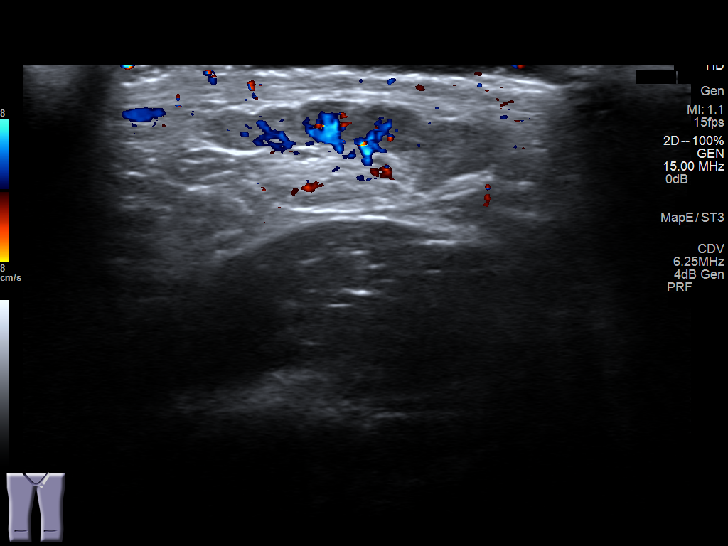
[im 2/17]
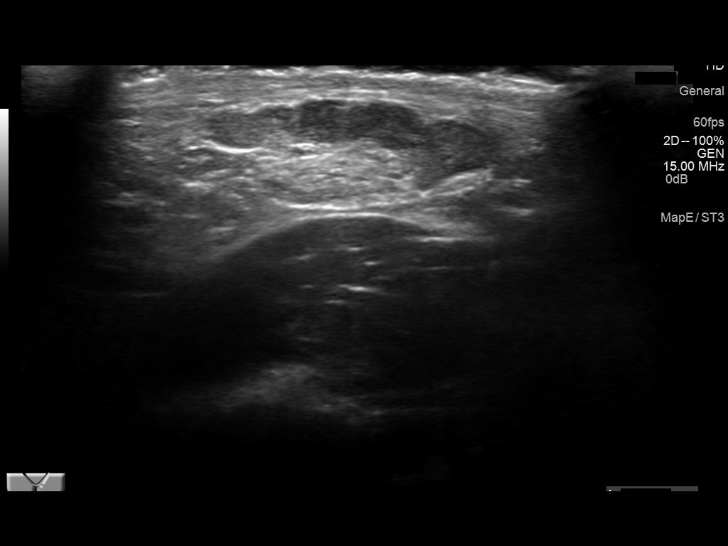
[im 4/17]
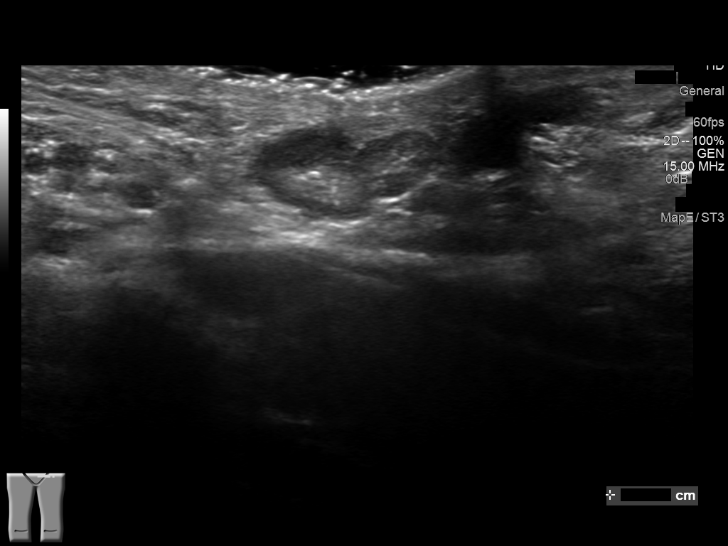
[im 5/17]
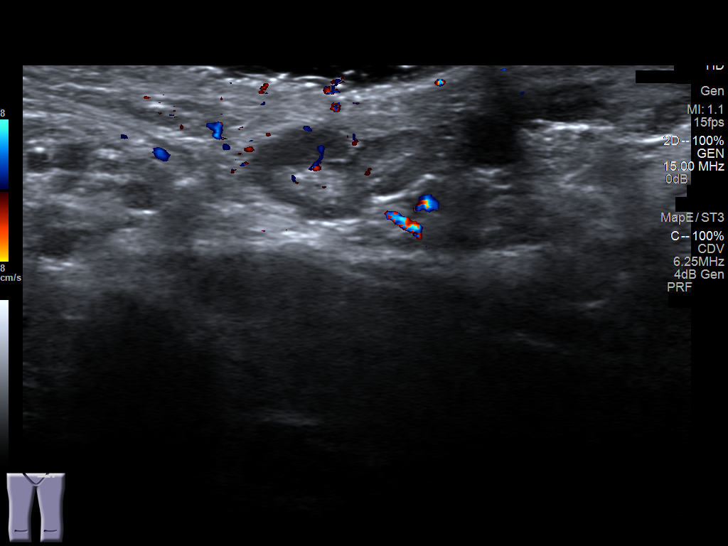
[im 6/17]
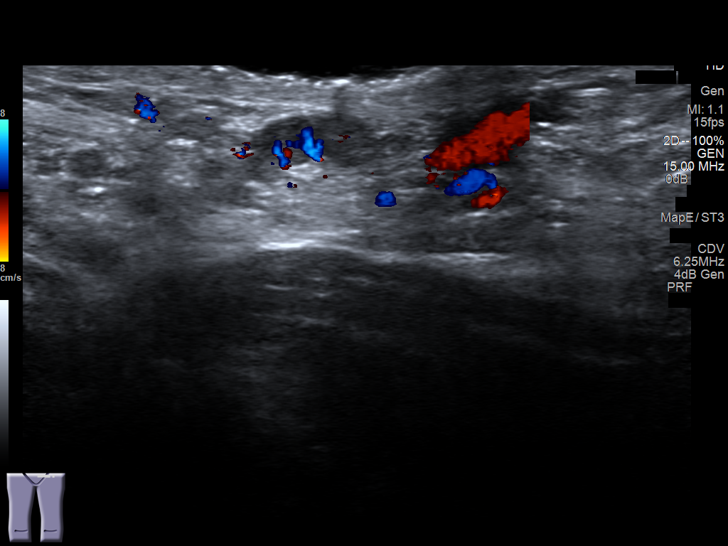
[im 7/17]
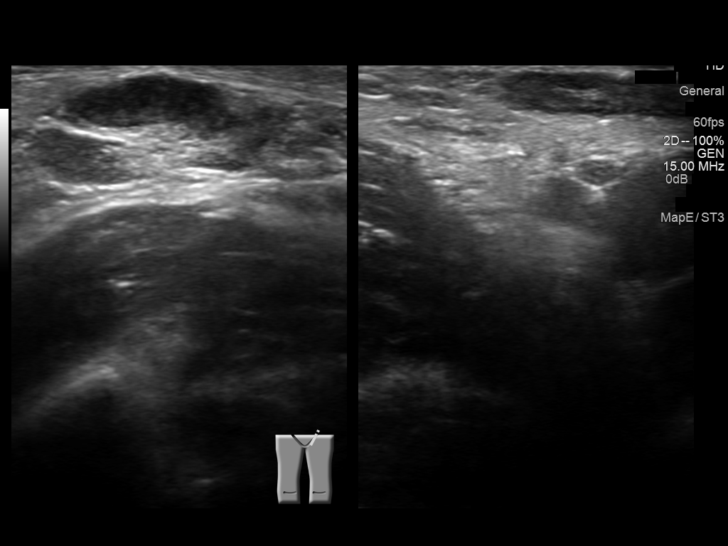
[im 8/17]
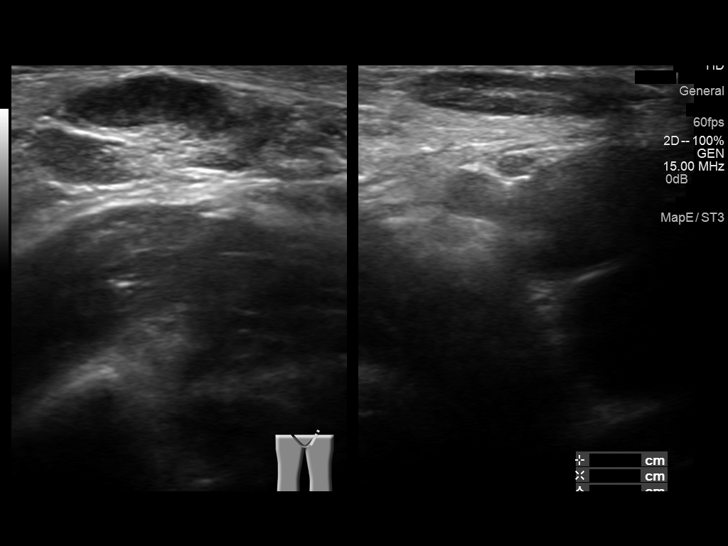
[im 10/17]
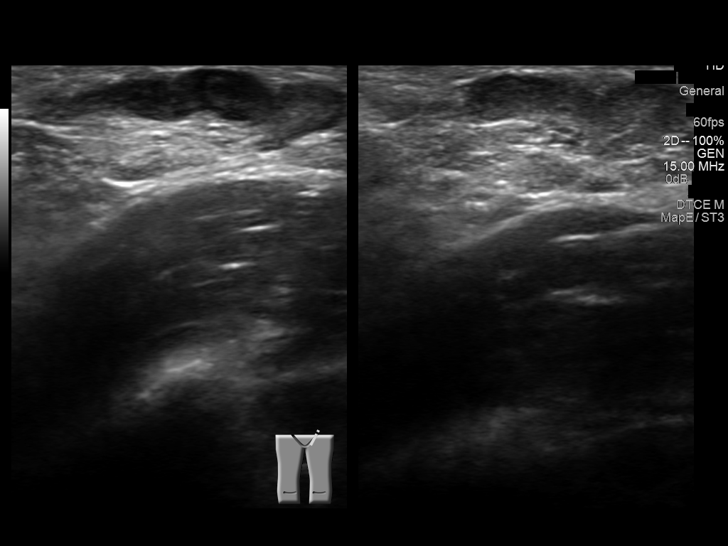
[im 11/17]
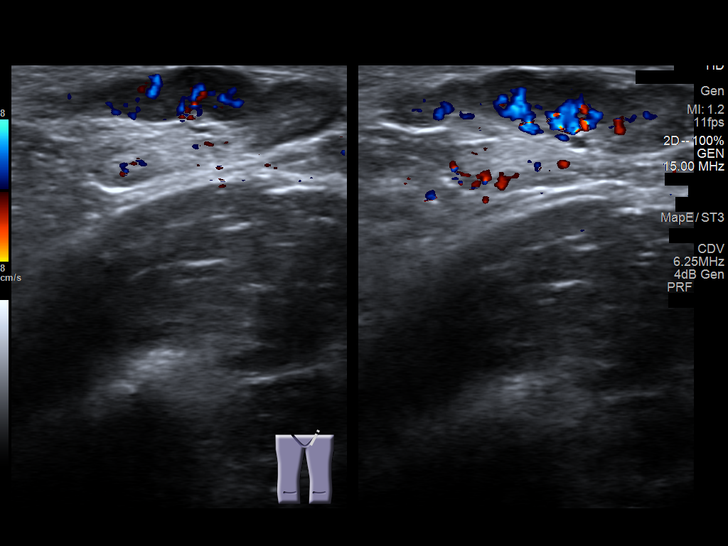
[im 12/17]
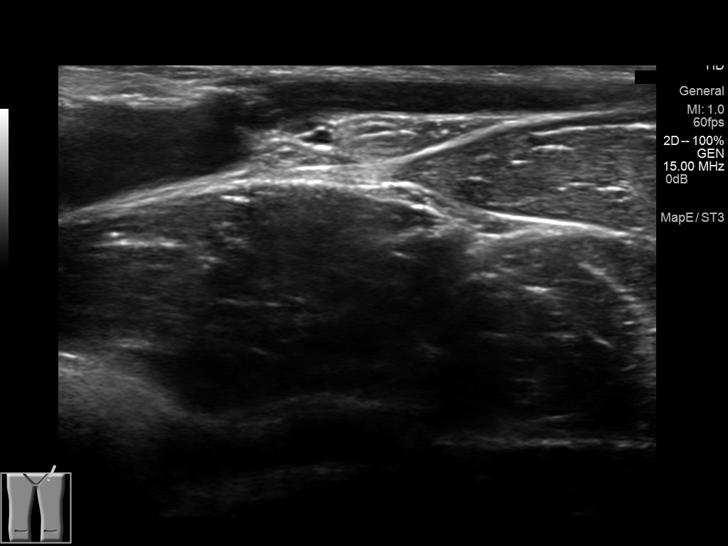
[im 13/17]
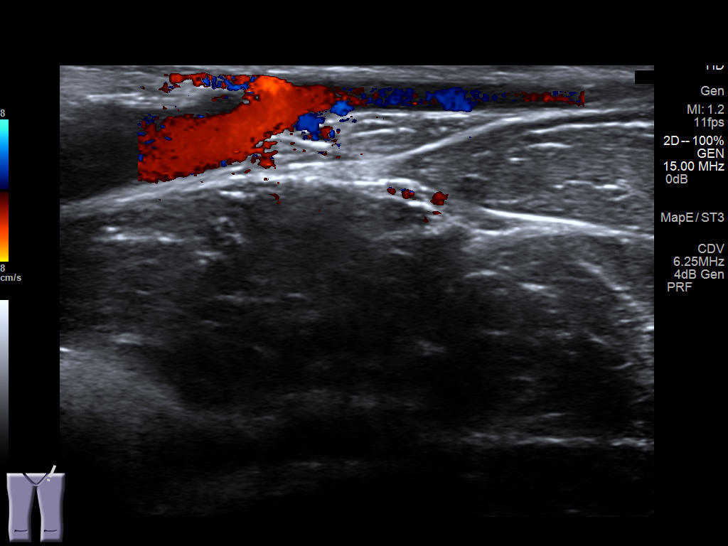
[im 14/17]
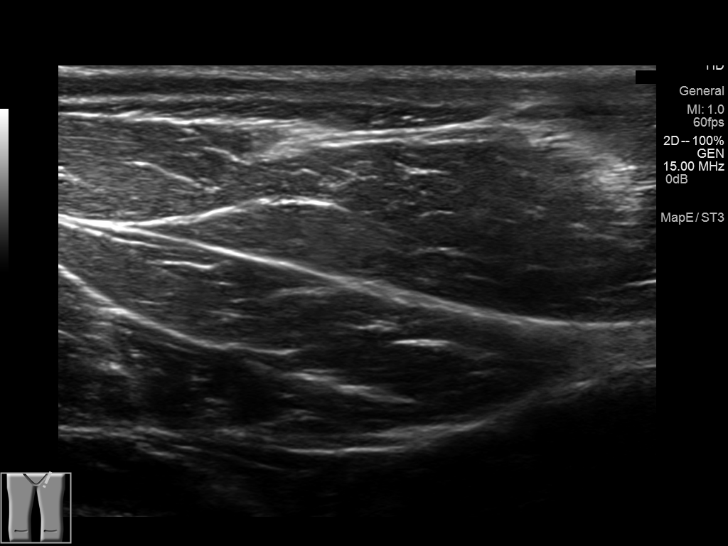
[im 16/17]
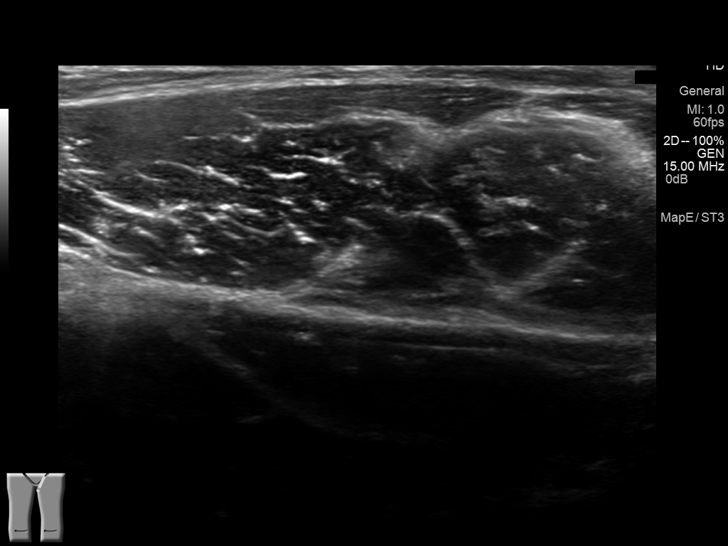
[im 17/17]
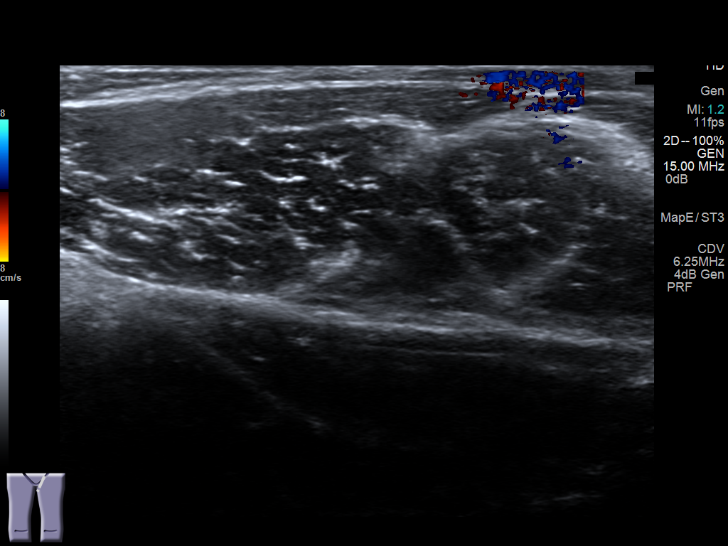

[14 of 17 positions shown; findings below may reference images not displayed]

FINDINGS: Scanning limited to the left groin area. There is a solid mass in
the left inguinal area that has the appearance of a lymph node with
hypoechoic cortex an a hyper echoic fatty hilum. This lymph node
measures 2.8 x 0.8 x 1.5 cm. No abscess identified. Doppler reveals
internal blood flow
IMPRESSION: 2.8 x 0.8 x 1.5 cm left inguinal lymph node without pathologic
features.

## 2017-06-08 ENCOUNTER — Other Ambulatory Visit: Payer: Self-pay

## 2017-06-08 ENCOUNTER — Emergency Department
Admission: EM | Admit: 2017-06-08 | Discharge: 2017-06-09 | Disposition: A | Discharge status: Other Acute Inpatient Hospital | Payer: Self-pay | Attending: Emergency Medicine | Admitting: Emergency Medicine

## 2017-06-08 DIAGNOSIS — Z9114 Patient's other noncompliance with medication regimen: Secondary | ICD-10-CM | POA: Insufficient documentation

## 2017-06-08 DIAGNOSIS — F329 Major depressive disorder, single episode, unspecified: Secondary | ICD-10-CM | POA: Insufficient documentation

## 2017-06-08 DIAGNOSIS — F32A Depression, unspecified: Secondary | ICD-10-CM

## 2017-06-08 DIAGNOSIS — F17210 Nicotine dependence, cigarettes, uncomplicated: Secondary | ICD-10-CM | POA: Insufficient documentation

## 2017-06-08 DIAGNOSIS — Z046 Encounter for general psychiatric examination, requested by authority: Secondary | ICD-10-CM | POA: Insufficient documentation

## 2017-06-08 DIAGNOSIS — Z79899 Other long term (current) drug therapy: Secondary | ICD-10-CM | POA: Insufficient documentation

## 2017-06-08 DIAGNOSIS — F1410 Cocaine abuse, uncomplicated: Secondary | ICD-10-CM

## 2017-06-08 LAB — COMPREHENSIVE METABOLIC PANEL
ALK PHOS: 66 U/L (ref 38–126)
ALT: 13 U/L — AB (ref 17–63)
AST: 19 U/L (ref 15–41)
Albumin: 4.6 g/dL (ref 3.5–5.0)
Anion gap: 13 (ref 5–15)
BILIRUBIN TOTAL: 0.8 mg/dL (ref 0.3–1.2)
BUN: 14 mg/dL (ref 6–20)
CALCIUM: 9.9 mg/dL (ref 8.9–10.3)
CHLORIDE: 102 mmol/L (ref 101–111)
CO2: 26 mmol/L (ref 22–32)
CREATININE: 1.06 mg/dL (ref 0.61–1.24)
Glucose, Bld: 131 mg/dL — ABNORMAL HIGH (ref 65–99)
Potassium: 3.3 mmol/L — ABNORMAL LOW (ref 3.5–5.1)
Sodium: 141 mmol/L (ref 135–145)
TOTAL PROTEIN: 8.1 g/dL (ref 6.5–8.1)

## 2017-06-08 LAB — SALICYLATE LEVEL

## 2017-06-08 LAB — URINE DRUG SCREEN, QUALITATIVE (ARMC ONLY)
Amphetamines, Ur Screen: NOT DETECTED
BARBITURATES, UR SCREEN: NOT DETECTED
Benzodiazepine, Ur Scrn: NOT DETECTED
CANNABINOID 50 NG, UR ~~LOC~~: POSITIVE — AB
Cocaine Metabolite,Ur ~~LOC~~: POSITIVE — AB
MDMA (ECSTASY) UR SCREEN: NOT DETECTED
Methadone Scn, Ur: NOT DETECTED
Opiate, Ur Screen: NOT DETECTED
PHENCYCLIDINE (PCP) UR S: NOT DETECTED
TRICYCLIC, UR SCREEN: NOT DETECTED

## 2017-06-08 LAB — CBC
HCT: 44.5 % (ref 40.0–52.0)
Hemoglobin: 14.9 g/dL (ref 13.0–18.0)
MCH: 29.9 pg (ref 26.0–34.0)
MCHC: 33.6 g/dL (ref 32.0–36.0)
MCV: 89 fL (ref 80.0–100.0)
PLATELETS: 178 10*3/uL (ref 150–440)
RBC: 4.99 MIL/uL (ref 4.40–5.90)
RDW: 13.6 % (ref 11.5–14.5)
WBC: 9.5 10*3/uL (ref 3.8–10.6)

## 2017-06-08 LAB — ACETAMINOPHEN LEVEL: Acetaminophen (Tylenol), Serum: <10 ug/mL — ABNORMAL LOW (ref 10–30)

## 2017-06-08 LAB — ETHANOL

## 2017-06-08 NOTE — ED Notes (Signed)
BEHAVIORAL HEALTH ROUNDING Patient sleeping: Yes.   Patient alert and oriented: not applicable SLEEPING Behavior appropriate: Yes.  ; If no, describe: SLEEPING Nutrition and fluids offered: No SLEEPING Toileting and hygiene offered: NoSLEEPING Sitter present: not applicable, Q 15 min safety rounds and observation. Law enforcement present: Yes ODS 

## 2017-06-08 NOTE — ED Provider Notes (Signed)
[facility] Emergency Department Provider Note   ____________________________________________   I have reviewed the triage vital signs and the nursing notes.   HISTORY  Chief Complaint Psychiatric Evaluation   History limited by: Not Limited   HPI Russell [name] MedicusGregory Coulston is a 41 y.o. male who presents to the emergency department today because of concern for what his thoughts might make him do.  DURATION:appears to be for a while TIMING: waxing and waning SEVERITY: severe QUALITY: suicidal thoughts CONTEXT: patient states that he has abused cocaine for quite some time. Intermittently on and off. He states that tied in with this is his psychiatric health. He has not been on his medication for a long time. States he hit the bottom of the barrel today. MODIFYING FACTORS: worse with drug use ASSOCIATED SYMPTOMS: denies any medical complaints  Per medical record review patient has a history of polysubstance abuse, depression.  No past medical history on file.  Patient Active Problem List   Diagnosis Date Noted  . Cocaine use disorder, severe, dependence (HCC) 09/16/2016  . Tobacco use disorder 09/16/2016  . 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 HERNIA REPAIR      Prior to Admission medications   Medication Sig Start Date End Date Taking? Authorizing Provider  hydrOXYzine (ATARAX/VISTARIL) 25 MG tablet Take 1 tablet (25 mg total) by mouth 3 (three) times daily as needed for anxiety. 09/21/16   Oneta RackLewis, Tanika N, NP  OLANZapine zydis (ZYPREXA) 5 MG disintegrating tablet Take 1 tablet (5 mg total) by mouth 3 (three) times daily as needed (severe anxiety/agitation). 09/21/16   Oneta RackLewis, Tanika N, NP  sertraline (ZOLOFT) 50 MG tablet Take 1 tablet (50 mg total) by mouth daily. 09/22/16   Oneta RackLewis, Tanika N, NP   traZODone (DESYREL) 50 MG tablet Take 1 tablet (50 mg total) by mouth at bedtime. 09/21/16   Oneta RackLewis, Tanika N, NP    Allergies Patient has no known allergies.  Family History  Problem Relation Age of Onset  . Diabetes Mother   . Depression Father     Social History Social History   Tobacco Use  . Smoking status: Current Every Day Smoker    Packs/day: 0.50    Types: Cigarettes  . Smokeless tobacco: Never Used  Substance Use Topics  . Alcohol use: No  . Drug use: Yes    Types: Cocaine, "Crack" cocaine    Review of Systems Constitutional: No fever/chills Eyes: No visual changes. ENT: No sore throat. Cardiovascular: Denies chest pain. Respiratory: Denies shortness of breath. Gastrointestinal: No abdominal pain.  No nausea, no vomiting.  No diarrhea.   Genitourinary: Negative for dysuria. Musculoskeletal: Negative for back pain. Skin: Negative for rash. Neurological: Negative for headaches, focal weakness or numbness.  ____________________________________________   PHYSICAL EXAM:  VITAL SIGNS: ED Triage Vitals [06/08/17 1906]  Enc Vitals Group     BP 107/60     Pulse Rate 78     Resp 20     Temp 97.9 F (36.6 C)     Temp Source Oral     SpO2 99 %     Weight 150 lb (68 kg)     Height 5\' 10"  (1.778 m)   Constitutional: Alert and oriented.  Eyes: Conjunctivae are normal.  ENT   Head: Normocephalic and atraumatic.   Nose: No congestion/rhinnorhea.   Mouth/Throat: Mucous membranes are  moist.   Neck: No stridor. Hematological/Lymphatic/Immunilogical: No cervical lymphadenopathy. Cardiovascular: Normal rate, regular rhythm.  No murmurs, rubs, or gallops.  Respiratory: Normal respiratory effort without tachypnea nor retractions. Breath sounds are clear and equal bilaterally. No wheezes/rales/rhonchi. Gastrointestinal: Soft and non tender. No rebound. No guarding.  Genitourinary: Deferred Musculoskeletal: Normal range of motion in all extremities. No  lower extremity edema. Neurologic:  Normal speech and language. No gross focal neurologic deficits are appreciated.  Skin:  Skin is warm, dry and intact. No rash noted. Psychiatric: Depressed  ____________________________________________    LABS (pertinent positives/negatives)  UDS positive for cocaine and cannabinoid CMP k 3.3, glu 131 Ethanol, salicylate and acetaminophen negative CBC wnl ____________________________________________   EKG  None  ____________________________________________    RADIOLOGY  None  ____________________________________________   PROCEDURES  Procedures  ____________________________________________   INITIAL IMPRESSION / ASSESSMENT AND PLAN / ED COURSE  Pertinent labs & imaging results that were available during my care of the patient were reviewed by me and considered in my medical decision making (see chart for details).  Patient presents to the emergency department today because of concerns for his own mental health.  On exam patient does appear slightly depressed although denies any current suicidal ideation.  He does admit to using cocaine.  Workup does show positive cocaine, cannabis.  Patient will be evaluated by specialist on call.  ______________________________________   FINAL CLINICAL IMPRESSION(S) / ED DIAGNOSES  Final diagnoses:  Cocaine abuse (HCC)  Depression, unspecified depression type     Note: This dictation was prepared with Dragon dictation. Any transcriptional errors that result from this process are unintentional     Phineas SemenGoodman, Chantay Whitelock, MD 06/08/17 2324

## 2017-06-08 NOTE — ED Notes (Addendum)
Person that brought pt in to ED is Luvenia ReddenRobert Comstock, [phone], is per is the AA sponsor of the pt

## 2017-06-08 NOTE — ED Triage Notes (Signed)
Patient reports has on going substance abuse problems (crack) and that he has been off his medications for approximately 1 month.  Patient reports racing thought and having suicidal thoughts but no plan.

## 2017-06-08 NOTE — ED Notes (Signed)
ENVIRONMENTAL ASSESSMENT  Potentially harmful objects out of patient reach: Yes.  Personal belongings secured: Yes.  Patient dressed in hospital provided attire only: Yes.  Plastic bags out of patient reach: Yes.  Patient care equipment (cords, cables, call bells, lines, and drains) shortened, removed, or accounted for: Yes.  Equipment and supplies removed from bottom of stretcher: Yes.  Potentially toxic materials out of patient reach: Yes.  Sharps container removed or out of patient reach: Yes.   BEHAVIORAL HEALTH ROUNDING  Patient sleeping: No.  Patient alert and oriented: yes  Behavior appropriate: Yes. ; If no, describe:  Nutrition and fluids offered: Yes  Toileting and hygiene offered: Yes  Sitter present: not applicable, Q 15 min safety rounds and observation via security camera. Law enforcement present: Yes ODS  

## 2017-06-08 NOTE — ED Notes (Signed)
BEHAVIORAL HEALTH ROUNDING  Patient sleeping: No.  Patient alert and oriented: yes  Behavior appropriate: Yes. ; If no, describe:  Nutrition and fluids offered: Yes  Toileting and hygiene offered: Yes  Sitter present: not applicable, Q 15 min safety rounds and observation via security camera. Law enforcement present: Yes ODS  

## 2017-06-09 ENCOUNTER — Inpatient Hospital Stay
Admission: AD | Admit: 2017-06-09 | Discharge: 2017-06-13 | DRG: 885 | Disposition: A | Discharge status: Home / Self Care | Payer: No Typology Code available for payment source | Source: Intra-hospital | Attending: Psychiatry | Admitting: Psychiatry

## 2017-06-09 DIAGNOSIS — G4700 Insomnia, unspecified: Secondary | ICD-10-CM | POA: Diagnosis present

## 2017-06-09 DIAGNOSIS — F1420 Cocaine dependence, uncomplicated: Secondary | ICD-10-CM | POA: Diagnosis present

## 2017-06-09 DIAGNOSIS — Z818 Family history of other mental and behavioral disorders: Secondary | ICD-10-CM | POA: Diagnosis not present

## 2017-06-09 DIAGNOSIS — R45851 Suicidal ideations: Secondary | ICD-10-CM | POA: Diagnosis present

## 2017-06-09 DIAGNOSIS — Z9119 Patient's noncompliance with other medical treatment and regimen: Secondary | ICD-10-CM

## 2017-06-09 DIAGNOSIS — Z590 Homelessness: Secondary | ICD-10-CM

## 2017-06-09 DIAGNOSIS — F410 Panic disorder [episodic paroxysmal anxiety] without agoraphobia: Secondary | ICD-10-CM | POA: Diagnosis present

## 2017-06-09 DIAGNOSIS — F429 Obsessive-compulsive disorder, unspecified: Secondary | ICD-10-CM | POA: Diagnosis present

## 2017-06-09 DIAGNOSIS — A150 Tuberculosis of lung: Secondary | ICD-10-CM

## 2017-06-09 DIAGNOSIS — F332 Major depressive disorder, recurrent severe without psychotic features: Principal | ICD-10-CM | POA: Diagnosis present

## 2017-06-09 DIAGNOSIS — F1220 Cannabis dependence, uncomplicated: Secondary | ICD-10-CM | POA: Diagnosis present

## 2017-06-09 DIAGNOSIS — F17210 Nicotine dependence, cigarettes, uncomplicated: Secondary | ICD-10-CM | POA: Diagnosis present

## 2017-06-09 DIAGNOSIS — Z79899 Other long term (current) drug therapy: Secondary | ICD-10-CM

## 2017-06-09 DIAGNOSIS — F4310 Post-traumatic stress disorder, unspecified: Secondary | ICD-10-CM | POA: Diagnosis present

## 2017-06-09 DIAGNOSIS — F17200 Nicotine dependence, unspecified, uncomplicated: Secondary | ICD-10-CM | POA: Diagnosis present

## 2017-06-09 DIAGNOSIS — F1410 Cocaine abuse, uncomplicated: Secondary | ICD-10-CM | POA: Diagnosis present

## 2017-06-09 MED ORDER — ALUM & MAG HYDROXIDE-SIMETH 200-200-20 MG/5ML PO SUSP: 30.0000 mL | ORAL | Status: DC | PRN

## 2017-06-09 MED ORDER — TRAZODONE HCL 100 MG PO TABS
100.0000 mg | ORAL_TABLET | Freq: Every day | ORAL | Status: DC
  Filled 2017-06-09 (×3): qty 1

## 2017-06-09 MED ORDER — TRAZODONE HCL 50 MG PO TABS: 50.0000 mg | ORAL_TABLET | Freq: Every evening | ORAL | Status: DC | PRN

## 2017-06-09 MED ORDER — OLANZAPINE 5 MG PO TBDP
5.0000 mg | ORAL_TABLET | Freq: Three times a day (TID) | ORAL | Status: DC | PRN
Start: 2017-06-09 — End: 2017-06-10

## 2017-06-09 MED ORDER — MAGNESIUM HYDROXIDE 400 MG/5ML PO SUSP: 30.0000 mL | Freq: Every day | ORAL | Status: DC | PRN

## 2017-06-09 MED ORDER — POTASSIUM CHLORIDE CRYS ER 20 MEQ PO TBCR
40.0000 meq | EXTENDED_RELEASE_TABLET | Freq: Once | ORAL | Status: AC
  Administered 2017-06-09: 40 meq via ORAL
  Filled 2017-06-09: qty 2

## 2017-06-09 MED ORDER — ACETAMINOPHEN 325 MG PO TABS: 650.0000 mg | ORAL_TABLET | Freq: Four times a day (QID) | ORAL | Status: DC | PRN

## 2017-06-09 MED ORDER — HYDROXYZINE HCL 25 MG PO TABS: 25.0000 mg | ORAL_TABLET | Freq: Three times a day (TID) | ORAL | Status: DC | PRN

## 2017-06-09 NOTE — Tx Team (Signed)
Initial Treatment Plan 06/09/2017 4:28 PM Russell [name] [name] [id number]    PATIENT STRESSORS: Financial difficulties Medication change or noncompliance Substance abuse   PATIENT STRENGTHS: Ability for insight Average or above average intelligence Communication skills Supportive family/friends   PATIENT IDENTIFIED PROBLEMS: Depression 06/09/2017  Suicidal 06/09/2017                   DISCHARGE CRITERIA:  Ability to meet basic life and health needs Improved stabilization in mood, thinking, and/or behavior Motivation to continue treatment in a less acute level of care  PRELIMINARY DISCHARGE PLAN: Attend aftercare/continuing care group Outpatient therapy Return to previous living arrangement  PATIENT/FAMILY INVOLVEMENT: This treatment plan has been presented to and reviewed with the patient, Russell MuslimBruce Russell Whitehead, and/or family member,   The patient and family have been given the opportunity to ask questions and make suggestions.  Crist InfanteGwen A Merikay Lesniewski, RN 06/09/2017, 4:28 PM

## 2017-06-09 NOTE — Progress Notes (Signed)
Reviewed chart will admit pt to psych and also replace potassium with 40 potassium chloride

## 2017-06-09 NOTE — ED Notes (Signed)
BEHAVIORAL HEALTH ROUNDING Patient sleeping: Yes.   Patient alert and oriented: not applicable SLEEPING Behavior appropriate: Yes.  ; If no, describe: SLEEPING Nutrition and fluids offered: No SLEEPING Toileting and hygiene offered: NoSLEEPING Sitter present: not applicable, Q 15 min safety rounds and observation. Law enforcement present: Yes ODS 

## 2017-06-09 NOTE — BH Assessment (Signed)
Assessment Note  Russell Whitehead is an 41 y.o. male presenting to the ED, voluntarily, with concerns of suicidal ideations without plan or intent and ongoing issues with drug addiction.  Pt has an history of alcohol/drug abuse and non-compliance with outpatient mental/substance treatment.  Pt reports issues with poor appetite, insomnia, irritability and feelings of hopelessness.  Pt is currently living with his mother.  UDS + for cocaine and cannabis.    Diagnosis: Per History, Major Depression  Past Medical History: No past medical history on file.  Past Surgical History:  Procedure Laterality Date  . HERNIA REPAIR      Family History:  Family History  Problem Relation Age of Onset  . Diabetes Mother   . Depression Father     Social History:  reports that he has been smoking cigarettes.  He has been smoking about 0.50 packs per day. he has never used smokeless tobacco. He reports that he uses drugs. Drugs: Cocaine and "Crack" cocaine. He reports that he does not drink alcohol.  Additional Social History:  Alcohol / Drug Use Pain Medications: See PTA Prescriptions: See PTA Over the Counter: See PTA History of alcohol / drug use?: Yes Longest period of sobriety (when/how long): not sure Negative Consequences of Use: Personal relationships, Financial Withdrawal Symptoms: Agitation Substance #1 Name of Substance 1: cocaine Substance #2 Name of Substance 2: cannabis  CIWA: CIWA-Ar BP: (!) 100/57 Pulse Rate: 80 COWS:    Allergies: No Known Allergies  Home Medications:  (Not in a hospital admission)  OB/GYN Status:  No LMP for male patient.  General Assessment Data [facility] Assessment: In system Is this a Tele or Face-to-Face Assessment?: Face-to-Face Is this an Initial Assessment or a Re-assessment for this encounter?: Initial Assessment Marital status: Divorced [place]: n/a Is patient pregnant?: No Pregnancy Status: No Living  Arrangements: Parent Can pt return to current living arrangement?: Yes Admission Status: Voluntary Is patient capable of signing voluntary admission?: Yes Referral Source: Self/Family/Friend Insurance type: None     Crisis Care Plan Living Arrangements: Parent Legal Guardian: Other:(self) Name of Psychiatrist: none reported Name of Therapist: none reported  Education Status Is patient currently in school?: No Current Grade: na Highest grade of school patient has completed: hs Name of school: na Contact person: na  Risk to self with the past 6 months Suicidal Ideation: Yes-Currently Present Has patient been a risk to self within the past 6 months prior to admission? : No Suicidal Intent: No Has patient had any suicidal intent within the past 6 months prior to admission? : No Is patient at risk for suicide?: No Suicidal Plan?: No Has patient had any suicidal plan within the past 6 months prior to admission? : No Access to Means: Yes Specify Access to Suicidal Means: Pt has acces to illicit drugs. What has been your use of drugs/alcohol within the last 12 months?: cannabis, cocaine Previous Attempts/Gestures: No Other Self Harm Risks: active drug addiction Triggers for Past Attempts: None known Intentional Self Injurious Behavior: None Family Suicide History: No Recent stressful life event(s): Other (Comment)(relapse on drugs) Persecutory voices/beliefs?: No Depression: Yes Depression Symptoms: Loss of interest in usual pleasures, Feeling worthless/self pity, Feeling angry/irritable Substance abuse history and/or treatment for substance abuse?: Yes Suicide prevention information given to non-admitted patients: Not applicable  Risk to Others within the past 6 months Homicidal Ideation: No Does patient have any lifetime risk of violence toward others beyond the six months prior to admission? :  No Thoughts of Harm to Others: No Current Homicidal Intent: No Current  Homicidal Plan: No Access to Homicidal Means: No Identified Victim: none identified History of harm to others?: No Assessment of Violence: None Noted Violent Behavior Description: none identified Does patient have access to weapons?: No Criminal Charges Pending?: No Does patient have a court date: No Is patient on probation?: No  Psychosis Hallucinations: None noted Delusions: None noted  Mental Status Report Appearance/Hygiene: In scrubs Eye Contact: Good Motor Activity: Freedom of movement Speech: Logical/coherent Level of Consciousness: Alert Mood: Depressed Affect: Apprehensive, Depressed Anxiety Level: Minimal Thought Processes: Relevant Judgement: Partial Orientation: Person, Place, Time, Situation Obsessive Compulsive Thoughts/Behaviors: None  Cognitive Functioning Concentration: Normal Memory: Recent Intact, Remote Intact IQ: Average Insight: Fair Impulse Control: Fair Appetite: Poor Weight Loss: 0 Weight Gain: 0 Sleep: Decreased Total Hours of Sleep: 5 Vegetative Symptoms: None  ADLScreening [facility] Assessment Services) Patient's cognitive ability adequate to safely complete daily activities?: Yes Patient able to express need for assistance with ADLs?: Yes Independently performs ADLs?: Yes (appropriate for developmental age)  Prior Inpatient Therapy Prior Inpatient Therapy: Yes Prior Therapy Dates: 09/2016 [facility] Reason for Treatment: depression  Prior Outpatient Therapy Prior Outpatient Therapy: Yes Prior Therapy Dates: 2017 Prior Therapy Facilty/Provider(s): [facility] Reason for Treatment: depression Does patient have an ACCT team?: No Does patient have Intensive In-House Services?  : No Does patient have [facility] services? : No Does patient have P4CC services?: No  ADL Screening (condition at time of admission) Patient's cognitive ability adequate to safely complete daily activities?: Yes Patient able to express need  for assistance with ADLs?: Yes Independently performs ADLs?: Yes (appropriate for developmental age)       Abuse/Neglect Assessment (Assessment to be complete while patient is alone) Abuse/Neglect Assessment Can Be Completed: Yes Physical Abuse: Denies Verbal Abuse: Denies Sexual Abuse: Denies Exploitation of patient/patient's resources: Denies Self-Neglect: Denies Values / Beliefs Cultural Requests During Hospitalization: None Spiritual Requests During Hospitalization: None Consults Spiritual Care Consult Needed: No Social Work Consult Needed: No Merchant navy officerAdvance Directives (For Healthcare) Does Patient Have a Medical Advance Directive?: No    Additional Information 1:1 In Past 12 Months?: No CIRT Risk: No Elopement Risk: No Does patient have medical clearance?: Yes     Disposition:  Disposition Initial Assessment Completed for this Encounter: Yes Disposition of Patient: Inpatient treatment program Type of inpatient treatment program: Adult  On Site Evaluation by:   Reviewed with Physician:    Artist Beachoxana C Sibel Khurana 06/09/2017 4:38 AM

## 2017-06-09 NOTE — Progress Notes (Signed)
Admission Note: [facility] [place] [facility]  D: Pt appeared depressed  With  a flat affect. Wearing paper scrubs . Patient presents  With suicidal ideations  Without  A plan . Patient has a history of Alcohol and drug abuse .Patient is noncompliant  With out patient treatment   Problems  With sleep , irritability hopelessness  And poor appetite.  Patient lives with mother.  Pt is redirectable and cooperative with assessment.      A: Pt admitted to unit per protocol, skin assessment and search done and no contraband found with Demetria RN.  Pt  educated on therapeutic milieu rules. Pt was introduced to milieu by nursing staff.    R: Pt was receptive to education about the milieu .  15 min safety checks started. Clinical research associatewriter offered support

## 2017-06-09 NOTE — ED Provider Notes (Signed)
-----------------------------------------   1:58 AM on 06/09/2017 -----------------------------------------  [facility] Dr[facility] Hermelinda MedicusAlvarado who recommends inpatient psychiatry admission. Will keep patient under voluntary status and [facility].   Irean HongSung, Jade J, MD 06/09/17 [id number]

## 2017-06-09 NOTE — BH Assessment (Signed)
[facility] and [facility].  Writer provided [facility] Rounding Physician (Dr. Selinda MichaelsIsbel) patient's information for review to [facility].  [facility] by Dr. Selinda MichaelsIsbel.  Attending Physician will be Dr. Jennet MaduroPucilowska.   Patient has been [facility] to room 311, [facility] Charge Nurse [place].   Intake Paper Work has been signed and placed on patient chart.  ER staff is aware of the admission Christen Bame(Ronnie, ER Sect.; Dr. Scotty CourtStafford, ER MD; Britta MccreedyBarbara, Patient's Nurse & Marylene LandAngela, Patient Access).

## 2017-06-09 NOTE — Plan of Care (Signed)
Verbalize understanding of information given concerning unit programing , other wise  this Is a new patient  un knowing of unit  programing

## 2017-06-09 NOTE — ED Notes (Signed)
Patient alert and oriented. Patient states he "wants to get back on his medication." Patient denies SI/HI and A/V hallucinations. Patient voices no concerns this morning. Patient provided support and encouragement. Patient with Q 15 minute checks in progress and patient remains safe on unit. Monitoring of patient continues.

## 2017-06-09 NOTE — ED Notes (Signed)
BEHAVIORAL HEALTH ROUNDING  Patient sleeping: No.  Patient alert and oriented: yes  Behavior appropriate: Yes. ; If no, describe:  Nutrition and fluids offered: Yes  Toileting and hygiene offered: Yes  Sitter present: not applicable, Q 15 min safety rounds and observation.  Law enforcement present: Yes ODS  

## 2017-06-09 NOTE — ED Notes (Signed)
[facility] and [facility] to [facility]. [facility]. Patient aware of treatment plan and admission to BMU; he verbalized understanding and voices no concerns. Patient medically stable and vitals 98.0-103/58-91-18-100% room air. All patient belongings sent with him to BMU. Patient transported by security and nursing staff via wheelchair.

## 2017-06-10 DIAGNOSIS — F332 Major depressive disorder, recurrent severe without psychotic features: Principal | ICD-10-CM

## 2017-06-10 LAB — COMPREHENSIVE METABOLIC PANEL
ALK PHOS: 56 U/L (ref 38–126)
ALT: 10 U/L — AB (ref 17–63)
AST: 16 U/L (ref 15–41)
Albumin: 3.9 g/dL (ref 3.5–5.0)
Anion gap: 8 (ref 5–15)
BILIRUBIN TOTAL: 0.5 mg/dL (ref 0.3–1.2)
BUN: 14 mg/dL (ref 6–20)
CALCIUM: 9.3 mg/dL (ref 8.9–10.3)
CO2: 25 mmol/L (ref 22–32)
CREATININE: 0.94 mg/dL (ref 0.61–1.24)
Chloride: 105 mmol/L (ref 101–111)
GFR calc non Af Amer: >60 mL/min (ref >60)
GLUCOSE: 118 mg/dL — AB (ref 65–99)
Potassium: 4.4 mmol/L (ref 3.5–5.1)
SODIUM: 138 mmol/L (ref 135–145)
TOTAL PROTEIN: 7.1 g/dL (ref 6.5–8.1)

## 2017-06-10 LAB — LIPID PANEL
Cholesterol: 176 mg/dL (ref 0–200)
HDL: 48 mg/dL (ref >40)
LDL CALC: 98 mg/dL (ref 0–99)
Total CHOL/HDL Ratio: 3.7 RATIO
Triglycerides: 151 mg/dL — ABNORMAL HIGH (ref <150)
VLDL: 30 mg/dL (ref 0–40)

## 2017-06-10 MED ORDER — OLANZAPINE 10 MG PO TABS
10.0000 mg | ORAL_TABLET | Freq: Every day | ORAL | Status: DC
  Administered 2017-06-10: 10 mg via ORAL
  Filled 2017-06-10 (×2): qty 1

## 2017-06-10 MED ORDER — NICOTINE 21 MG/24HR TD PT24: 21.0000 mg | MEDICATED_PATCH | Freq: Every day | TRANSDERMAL | Status: DC

## 2017-06-10 MED ORDER — SERTRALINE HCL 100 MG PO TABS
100.0000 mg | ORAL_TABLET | Freq: Every day | ORAL | Status: DC
  Administered 2017-06-10 – 2017-06-13 (×4): 100 mg via ORAL
  Filled 2017-06-10 (×4): qty 1

## 2017-06-10 NOTE — Plan of Care (Signed)
Pt. Verbalizing understanding of general education. Pt. Able to remain safe on the unit and verbally contract for safety. Pt. Denies SI/HI this evening. Pt. Verbalizes improvement in feeling to this Clinical research associatewriter. Pt reports improved appetite this evening and nutritional intake during snacks and meals.

## 2017-06-10 NOTE — BHH Suicide Risk Assessment (Signed)
B[facility] INPATIENT:  Family/Significant Other Suicide Prevention Education  Suicide Prevention Education:  Education Completed; Rosalyn GessLessie Thompson, mother, [phone], has been identified by the patient as the family member/significant other with whom the patient will be residing, and identified as the person(s) who will aid the patient in the event of a mental health crisis (suicidal ideations/suicide attempt).  With written consent from the patient, the family member/significant other has been provided the following suicide prevention education, prior to the and/or following the discharge of the patient.  The suicide prevention education provided includes the following:  Suicide risk factors  Suicide prevention and interventions  National Suicide Hotline telephone number  [facility] assessment telephone number  [facility] Emergency Assistance 911  [facility] and/or Residential Mobile Crisis Unit telephone number  Request made of family/significant other to:  Remove weapons (e.g., guns, rifles, knives), all items previously/currently identified as safety concern.  Mother is unsure if pt has access to guns.  Remove drugs/medications (over-the-counter, prescriptions, illicit drugs), all items previously/currently identified as a safety concern.  The family member/significant other verbalizes understanding of the suicide prevention education information provided.  The family member/significant other agrees to remove the items of safety concern listed above.  Per mother, pt was doing well prior to his relapse.  He has now lost his housing, job, and truck (which he gave to his drug dealer as payment)  Pt cannot return to her home and she agrees that he needs residential treatment.  Lorri FrederickWierda, Annete Ayuso Jon, LCSW 06/10/2017, 3:10 PM

## 2017-06-10 NOTE — BHH Group Notes (Signed)
06/10/2017 0930   Type of Therapy and Topic:  Group Therapy:  Overcoming Obstacles   Participation Level:  Did Not Attend   Description of Group:   In this group patients will be encouraged to explore what they see as obstacles to their own wellness and recovery. They will be guided to discuss their thoughts, feelings, and behaviors related to these obstacles. The group will process together ways to cope with barriers, with attention given to specific choices patients can make. Each patient will be challenged to identify changes they are motivated to make in order to overcome their obstacles. This group will be process-oriented, with patients participating in exploration of their own experiences, giving and receiving support, and processing challenge from other group members.   Therapeutic Goals: 1. Patient will identify personal and current obstacles as they relate to admission. 2. Patient will identify barriers that currently interfere with their wellness or overcoming obstacles.  3. Patient will identify feelings, thought process and behaviors related to these barriers. 4. Patient will identify two changes they are willing to make to overcome these obstacles:      Summary of Patient Progress Pt was invited to attend group but chose not to attend.    Therapeutic Modalities:   Cognitive Behavioral Therapy Solution Focused Therapy Motivational Interviewing Relapse Prevention Therapy  Heidi DachKelsey Sricharan Lacomb, MSW, LCSW 06/10/2017 10:23 AM

## 2017-06-10 NOTE — B[facility] Group Notes (Signed)
B[facility] Group Notes:  (Nursing/MHT/Case Management/Adjunct)  Date:  06/10/2017  Time:  2:57 PM  Type of Therapy:  Psychoeducational Skills  Participation Level:  Did Not Attend    Russell Whitehead M Russell Whitehead 06/10/2017, 2:57 PM

## 2017-06-10 NOTE — Plan of Care (Signed)
Patient denies si, hi, avh. No pain. Up on the unit. He is social with peers. States he has some anxiety. Will continue to monitor.

## 2017-06-10 NOTE — [facility] Suicide Risk Assessment (Signed)
[facility]   Nursing information obtained from:    Demographic factors:    Current Mental Status:    Loss Factors:    Historical Factors:    Risk Reduction Factors:     Total Time spent with patient: 1 hour Principal Problem: <principal problem not specified> Diagnosis:   Patient Active Problem List   Diagnosis Date Noted  . Cocaine use disorder (HCC) [F14.10] 06/09/2017  . Cannabis use disorder, moderate, dependence (HCC) [F12.20] 06/09/2017  . Cocaine use disorder, severe, dependence (HCC) [F14.20] 09/16/2016  . Tobacco use disorder [F17.200] 09/16/2016  . MDD (major depressive disorder), recurrent severe, without psychosis (HCC) [F33.2] 09/16/2016  . 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  World Science writerHealth Organization [facility]WHO). Score between 0-7:  no or low risk or alcohol related problems. Score between 8-15:  moderate risk of alcohol related problems. Score between 16-19:  high risk of alcohol related problems. Score 20 or above:  warrants further diagnostic evaluation for alcohol dependence and treatment.   CLINICAL FACTORS:   Bipolar Disorder:   Depressive phase Depression:   Comorbid alcohol abuse/dependence Impulsivity Alcohol/Substance Abuse/Dependencies Unstable or Poor Therapeutic Relationship   Musculoskeletal: Strength & Muscle Tone: within normal limits Gait & Station: normal Patient leans: N/A  Psychiatric Specialty Exam: Physical Exam  Nursing note and vitals reviewed. Psychiatric: His affect is angry. His speech is rapid and/or pressured. He is hyperactive. Cognition and memory are normal. He expresses impulsivity. He expresses suicidal ideation. He expresses suicidal plans.     Review of Systems  Neurological: Negative.   Psychiatric/Behavioral: Positive for depression, substance abuse and suicidal ideas. The patient has insomnia.   All other systems reviewed and are negative.   Blood pressure 104/74, pulse (!) 54, temperature 97.9 F (36.6 C), temperature source Oral, resp. rate 18, height 5\' 10"  (1.778 m), weight 68.5 kg (151 lb), SpO2 100 %.Body mass index is 21.67 kg/m.  General Appearance: Casual  Eye Contact:  Poor  Speech:  Pressured  Volume:  Increased  Mood:  Dysphoric and Irritable  Affect:  Congruent  Thought Process:  Goal Directed and Descriptions of Associations: Intact  Orientation:  Full (Time, Place, and Person)  Thought Content:  WDL  Suicidal Thoughts:  Yes.  with intent/plan  Homicidal Thoughts:  No  Memory:  Immediate;   Fair Recent;   Fair Remote;   Fair  Judgement:  Poor  Insight:  Lacking  Psychomotor Activity:  Increased  Concentration:  Concentration: Fair and Attention Span: Fair  Recall:  FiservFair  Fund of Knowledge:  Fair  Language:  Fair  Akathisia:  No  Handed:  Right  AIMS (if indicated):     Assets:  Communication Skills Desire for Improvement Housing Physical Health Resilience Social Support  ADL's:  Intact  Cognition:  WNL  Sleep:         COGNITIVE FEATURES THAT CONTRIBUTE TO RISK:  None    SUICIDE RISK:   Moderate:  Frequent suicidal ideation with limited intensity, and duration, some specificity in terms of plans, no associated intent, good self-control, limited dysphoria/symptomatology, some risk factors present, and identifiable protective factors, including available and accessible social support.  PLAN OF CARE: hospital admission, medication management, substance abuse counseling, discharge planning.  [name] is a 41 year old male with a  history of bipoler depression and substance abuse admitted for suicidal ideation in the context of treatment noncompliance and relapse on cocaine.  #Suicidal  ideation -patient able to contract for safety in the hospital  #Mood -start Zyprexa 10 mg nightly  #Insomnia -Trazodone is available  #Smoking cessation -Nicotine patch is available  #Substance abuse -desires residential treatment  #Metabolic syndrome monitoring -Lipid panel, TSH and HgbA1C are pending -EKG, pending  #Disposition -TBE  I certify that inpatient services furnished can reasonably be expected to improve the patient's condition.   Kristine LineaJolanta Deantre Bourdon, MD 06/10/2017, 4:54 PM
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
## 2017-06-10 NOTE — Progress Notes (Signed)
Recreation Therapy Notes  Date: 12.03.2018  Time: 9:30 am  Location: Craft Room  Behavioral response: Appropriate  Intervention Topic: Emotions  Discussion/Intervention: Group content on today was focused on emotions. The group identified what emotions are and why it is important to have emotions. Patients expressed some positive and negative emotions. Individuals gave some past experiences on how they normally dealt with emotions in the past. The group described some positive ways to deal with emotions in the future. Patients participated in the intervention "The Situation" where individuals were given a chance to respond to certain situations involving their emotions.  Clinical Observations/Feedback:  Patient came to group and described emotions as responses to situations. He explained that some people normally ignore their emotions or deal with them. Individual identified talking things out as a positive way to deal with emotions. Patient came to group and was social with peers and staff while participating in the intervention.  [name] LRT/CTRS         [name] 06/10/2017 12:10 PM

## 2017-06-10 NOTE — H&P (Addendum)
Psychiatric Admission Assessment Adult  Patient Identification: Russell MuslimBruce Gregory Whitehead MRN:  [id number] Date of Evaluation:  06/13/2017 Chief Complaint:  Depression Principal Diagnosis: MDD (major depressive disorder), recurrent severe, without psychosis (HCC) Diagnosis:   Patient Active Problem List   Diagnosis Date Noted  . MDD (major depressive disorder), recurrent severe, without psychosis (HCC) [F33.2] 09/16/2016    Priority: High  . Cocaine use disorder (HCC) [F14.10] 06/09/2017  . Cannabis use disorder, moderate, dependence (HCC) [F12.20] 06/09/2017  . Cocaine use disorder, severe, dependence (HCC) [F14.20] 09/16/2016  . Tobacco use disorder [F17.200] 09/16/2016  . 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 Russell Whitehead is a 41 year old male with a history of mood instability and substance abuse.  Chief complaint. "I am trying to get better."  History of present illness. Information was obtained from the patient and the chart. The patient came to the ER with his NA sponsor complaining of suicidal ideation. He has not been compliant with treatment and relapsed on cocaine for the past two days. He reports poor sleep, decreased appetite, anhedonia, poor energy and concentration for the past several months. He denies psychotic symptoms. He reports infrequent panic attacks, nightmares and flashbacks of PTSD and OCD symptoms with cleaning and organizing. He uses cocaine, no alcohol or other drugs.  Past psychiatric history. This is his 6th psychiatric hospitalization for depression or substances. He denies ever attempting suicide. He went to rehab several times, last time at and remained sober for 5 months. His longest sobriety was 3 years in 2014.  Family psychiatric history. Father with depression.  Social history. He lives with his mother, goes to NA.  Total Time spent with patient: 1 hour  Is the patient at risk to  self? Yes.    Has the patient been a risk to self in the past 6 months? No.  Has the patient been a risk to self within the distant past? No.  Is the patient a risk to others? No.  Has the patient been a risk to others in the past 6 months? No.  Has the patient been a risk to others within the distant past? No.   Prior Inpatient Therapy:   Prior Outpatient Therapy:    Alcohol Screening: 1. How often do you have a drink containing alcohol?: Monthly or less 2. How many drinks containing alcohol do you have on a typical day when you are drinking?: 1 or 2 3. How often do you have six or more drinks on one occasion?: Never AUDIT-C Score: 1 4. How often during the last year have you found that you were not able to stop drinking once you had started?: Never 5. How often during the last year have you failed to do what was normally expected from you becasue of drinking?: Never 6. How often during the last year have you needed a first drink in the morning to get yourself going after a heavy drinking session?: Never 7. How often during the last year have you had a feeling of guilt of remorse after drinking?: Never 8. How often during the last year have you been unable to remember what happened the night before because you had been drinking?: Never 9. Have you or someone else been injured as a result of your drinking?: No 10. Has a relative or friend or a doctor or another health worker been concerned about your drinking or suggested you cut down?: No Alcohol Use Disorder Identification Test Final  Score (AUDIT): 1 Intervention/Follow-up: AUDIT Score <7 follow-up not indicated Substance Abuse History in the last 12 months:  Yes.   Consequences of Substance Abuse: Negative Previous Psychotropic Medications: Yes  Psychological Evaluations: No  Past Medical History: History reviewed. No pertinent past medical history.  Past Surgical History:  Procedure Laterality Date  . HERNIA REPAIR     Family  History:  Family History  Problem Relation Age of Onset  . Diabetes Mother   . Depression Father    Tobacco Screening:   Social History:  Social History   Substance and Sexual Activity  Alcohol Use No     Social History   Substance and Sexual Activity  Drug Use Yes  . Types: Cocaine, "Crack" cocaine    Additional Social History: Marital status: Divorced Divorced, when?: 2001 Additional relationship information: Pt is not in a current relationship Are you sexually active?: No What is your sexual orientation?: heterosexual Has your sexual activity been affected by drugs, alcohol, medication, or emotional stress?: na Does patient have children?: Yes How many children?: 3 How is patient's relationship with their children?: Pt has 3 kids, ages 6722, 2216, and 529.  Pt reports his relationship with his kids is "strained."                         Allergies:  No Known Allergies Lab Results:  No results found for this or any previous visit (from the past 48 hour(s)).  Blood Alcohol level:  Lab Results  Component Value Date   ETH <10 06/08/2017   ETH <5 09/14/2016    Metabolic Disorder Labs:  Lab Results  Component Value Date   HGBA1C 5.8 (H) 06/10/2017   MPG 120 06/10/2017   No results found for: PROLACTIN Lab Results  Component Value Date   CHOL 176 06/10/2017   TRIG 151 (H) 06/10/2017   HDL 48 06/10/2017   CHOLHDL 3.7 06/10/2017   VLDL 30 06/10/2017   LDLCALC 98 06/10/2017    Current Medications: Current Facility-Administered Medications  Medication Dose Route Frequency Provider Last Rate Last Dose  . acetaminophen (TYLENOL) tablet 650 mg  650 mg Oral Q6H PRN Cindee LameIsbell, Lauren M, MD      . alum & mag hydroxide-simeth (MAALOX/MYLANTA) 200-200-20 MG/5ML suspension 30 mL  30 mL Oral Q4H PRN Cindee LameIsbell, Lauren M, MD      . hydrOXYzine (ATARAX/VISTARIL) tablet 25 mg  25 mg Oral TID PRN Cindee LameIsbell, Lauren M, MD      . magnesium hydroxide (MILK OF MAGNESIA) suspension 30  mL  30 mL Oral Daily PRN Cindee LameIsbell, Lauren M, MD      . nicotine (NICODERM CQ - dosed in mg/24 hours) patch 21 mg  21 mg Transdermal Daily Pucilowska, Jolanta B, MD      . sertraline (ZOLOFT) tablet 100 mg  100 mg Oral Daily Pucilowska, Jolanta B, MD   100 mg at 06/13/17 0831   PTA Medications: Medications Prior to Admission  Medication Sig Dispense Refill Last Dose  . hydrOXYzine (ATARAX/VISTARIL) 25 MG tablet Take 1 tablet (25 mg total) by mouth 3 (three) times daily as needed for anxiety. 30 tablet 0   . OLANZapine zydis (ZYPREXA) 5 MG disintegrating tablet Take 1 tablet (5 mg total) by mouth 3 (three) times daily as needed (severe anxiety/agitation). 30 tablet 0   . sertraline (ZOLOFT) 50 MG tablet Take 1 tablet (50 mg total) by mouth daily. 30 tablet 0   . traZODone (DESYREL) 50  MG tablet Take 1 tablet (50 mg total) by mouth at bedtime. 30 tablet 0     Musculoskeletal: Strength & Muscle Tone: within normal limits Gait & Station: normal Patient leans: N/A  Psychiatric Specialty Exam: Physical Exam  Nursing note and vitals reviewed. Constitutional: He is oriented to person, place, and time. He appears well-developed and well-nourished.  HENT:  Head: Normocephalic and atraumatic.  Eyes: Conjunctivae and EOM are normal. Pupils are equal, round, and reactive to light.  Neck: Normal range of motion. Neck supple.  Cardiovascular: Normal rate, regular rhythm and normal heart sounds.  Respiratory: Effort normal and breath sounds normal.  GI: Soft. Bowel sounds are normal.  Musculoskeletal: Normal range of motion.  Neurological: He is alert and oriented to person, place, and time.  Skin: Skin is warm and dry.  Psychiatric: His affect is labile. His speech is rapid and/or pressured. He is hyperactive. Cognition and memory are normal. He expresses impulsivity. He expresses suicidal ideation. He expresses suicidal plans.    Review of Systems  Neurological: Negative.   Psychiatric/Behavioral:  Positive for depression, substance abuse and suicidal ideas.  All other systems reviewed and are negative.   Blood pressure 94/70, pulse (!) 54, temperature 98 F (36.7 C), resp. rate 18, height 5\' 10"  (1.778 m), weight 68.5 kg (151 lb), SpO2 99 %.Body mass index is 21.67 kg/m.  See SRA.                                                  Sleep:  Number of Hours: 7.15    Treatment Plan Summary: Daily contact with patient to assess and evaluate symptoms and progress in treatment and Medication management   Mr. Helderman is a 41 year old male with a history of bipoler depression and substance abuse admitted for suicidal ideation in the context of treatment noncompliance and relapse on cocaine.  #Suicidal ideation -patient able to contract for safety in the hospital  #Mood -start Zyprexa 10 mg nightly  #Insomnia -Trazodone is available  #Smoking cessation -Nicotine patch is available  #Substance abuse -desires residential treatment  #Metabolic syndrome monitoring -Lipid panel, TSH and HgbA1C are pending -EKG, pending  #Disposition -TBE   Observation Level/Precautions:  15 minute checks  Laboratory:  CBC Chemistry Profile UDS UA  Psychotherapy:    Medications:    Consultations:    Discharge Concerns:    Estimated LOS:  Other:     Physician Treatment Plan for Primary Diagnosis: MDD (major depressive disorder), recurrent severe, without psychosis (HCC) Long Term Goal(s): Improvement in symptoms so as ready for discharge  Short Term Goals: Ability to identify changes in lifestyle to reduce recurrence of condition will improve, Ability to verbalize feelings will improve, Ability to disclose and discuss suicidal ideas, Ability to demonstrate self-control will improve, Ability to identify and develop effective coping behaviors will improve, Compliance with prescribed medications will improve and Ability to identify triggers associated with substance  abuse/mental health issues will improve  Physician Treatment Plan for Secondary Diagnosis: Principal Problem:   MDD (major depressive disorder), recurrent severe, without psychosis (HCC) Active Problems:   Cocaine use disorder, severe, dependence (HCC)   Tobacco use disorder   Cocaine use disorder (HCC)   Cannabis use disorder, moderate, dependence (HCC)  Long Term Goal(s): Improvement in symptoms so as ready for discharge  Short Term Goals: Ability to identify  changes in lifestyle to reduce recurrence of condition will improve, Ability to demonstrate self-control will improve and Ability to identify triggers associated with substance abuse/mental health issues will improve  I certify that inpatient services furnished can reasonably be expected to improve the patient's condition.    Kristine Linea, MD 12/6/20189:26 AM

## 2017-06-10 NOTE — Progress Notes (Signed)
D:Pt denies SI/HI/AVH. Pt is pleasant and cooperative, but is slightly guarded upon interaction at times. Pt. has no complaints this evening.  A: Q x 15 minute observation checks were completed for safety. Patient was provided with education. Patient refused medications this evening. Patient  was encourage to attend groups, participate in unit activities and continue with plan of care.   R:Patient is compliant with unit procedures and attending groups. Pt. Verbalizing understanding of general education. Pt. Able to remain safe on the unit and verbally contract for safety. Pt. Verbalizes improvement in feeling to this Clinical research associatewriter. Pt reports improved appetite this evening and nutritional intake during snacks and meals.              Patient slept for Estimated Hours of 7.45; Precautionary checks every 15 minutes for safety maintained, room free of safety hazards, patient sustains no injury or falls during this shift.

## 2017-06-10 NOTE — Progress Notes (Signed)
Recreation Therapy Notes  INPATIENT RECREATION THERAPY ASSESSMENT  Patient Details Name: Arlee MuslimBruce Gregory Morera MRN: [id number] DOB: 1976-07-01 Today's Date: 06/10/2017  Patient Stressors: Family, Relationship, Other (Comment)(Drugs)  Coping Skills:   Substance Abuse, Talking, Music, Other (Comment)(AA/NA)  Personal Challenges: Anger, Expressing Yourself, Communication, Stress Management, Substance Abuse, Trusting Others  Leisure Interests (2+):  Individual - Reading, Mining engineerature - Fishing  Awareness of Community Resources:  Yes  Community Resources:  Other (Comment)([facility],AA/NA)  Current Use: Yes  If no, Barriers?:    Patient Strengths:  Awareness  Patient Identified Areas of Improvement:  action, consistency  Current Recreation Participation:  N/A  Patient Goal for Hospitalization:  Treatment for substance  [place]  County of Residence:  [place]   Current SI (including self-harm):  No  Current HI:  No  Consent to Intern Participation: N/A   Latwan Luchsinger 06/10/2017, 2:14 PM

## 2017-06-11 LAB — HEMOGLOBIN A1C
HEMOGLOBIN A1C: 5.8 % — AB (ref 4.8–5.6)
MEAN PLASMA GLUCOSE: 120 mg/dL

## 2017-06-11 NOTE — BHH Group Notes (Signed)
06/11/2017 9:30am  Type of Therapy/Topic:  Group Therapy:  Feelings about Diagnosis  Participation Level:  Active   Description of Group:   This group will allow patients to explore their thoughts and feelings about diagnoses they have received. Patients will be guided to explore their level of understanding and acceptance of these diagnoses. Facilitator will encourage patients to process their thoughts and feelings about the reactions of others to their diagnosis and will guide patients in identifying ways to discuss their diagnosis with significant others in their lives. This group will be process-oriented, with patients participating in exploration of their own experiences, giving and receiving support, and processing challenge from other group members.   Therapeutic Goals: 1. Patient will demonstrate understanding of diagnosis as evidenced by identifying two or more symptoms of the disorder 2. Patient will be able to express two feelings regarding the diagnosis 3. Patient will demonstrate their ability to communicate their needs through discussion and/or role play  Summary of Patient Progress:  Stayed the entire time, Actively participated. Russell Whitehead states that he is feeling "Frustrated due to the same re-occurring situation" regarding his diagnosis. He says that "I've been through this same situation before and I'm frustrated because I knew what I should have done to avoid this but I didn't do it." He also mentions that when first diagnosed he was in disbelief due to "having fear of having a stigma placed on him". Mood was good.       Therapeutic Modalities:   Cognitive Behavioral Therapy Brief Therapy Feelings Identification    Johny ShearsCassandra  Jazmynn Pho, LCSW 06/11/2017 10:18 AM

## 2017-06-11 NOTE — B[facility] Group Notes (Signed)
[facility] Group Notes:  (Nursing/MHT/Case Management/Adjunct)  Date:  06/11/2017  Time:  3:06 PM  Type of Therapy:  Psychoeducational Skills  Participation Level:  Active  Participation Quality:  Attentive  Affect:  Appropriate  Cognitive:  Alert  Insight:  Good  Engagement in Group:  Engaged  Modes of Intervention:  Activity  Summary of Progress/Problems:  Sebastian AcheJasmine  Chirstina Haan 06/11/2017, 3:06 PM

## 2017-06-11 NOTE — Progress Notes (Signed)
[facility] MD Progress Note  06/11/2017 11:59 AM Russell Whitehead  MRN:  [id number]  Subjective:   Russell Whitehead is in bed this morning reading a book. He is not as irritable as yesterday. Slept well last night without any mdications. In fact he refuse Trazodone and Zyprexa as he was not sure about mood stabilizer. He took Zoloft this morning. The patient desires rehab but only reluctantly agrees to [facility]. He has been there before. The program is "too short".   Treatment plan. We will discontinue Zyprexa. He did not like Tegretol in the past neither.  Social/disposition. We are looking for residential bed. [facility] has openings.  Principal Problem: MDD (major depressive disorder), recurrent severe, without psychosis ([place]) Diagnosis:   Patient Active Problem List   Diagnosis Date Noted  . Cocaine use disorder ([place]) [F14.10] 06/09/2017  . Cannabis use disorder, moderate, dependence ([place]) [F12.20] 06/09/2017  . Cocaine use disorder, severe, dependence ([place]) [F14.20] 09/16/2016  . Tobacco use disorder [F17.200] 09/16/2016  . MDD (major depressive disorder), recurrent severe, without psychosis ([place]) [F33.2] 09/16/2016  . 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  Past Medical History: History reviewed. No pertinent past medical history.  Past Surgical History:  Procedure Laterality Date  . HERNIA REPAIR     Family History:  Family History  Problem Relation Age of Onset  . Diabetes Mother   . Depression Father    Family Psychiatric  History: father with depression. Social History:  Social History   Substance and Sexual Activity  Alcohol Use No     Social History   Substance and Sexual Activity  Drug Use Yes  . Types: Cocaine, "Crack" cocaine    Social History   Socioeconomic History  . Marital status: Single    Spouse name: None  . Number of children: None  .  Years of education: None  . Highest education level: None  Social Needs  . Financial resource strain: None  . Food insecurity - worry: None  . Food insecurity - inability: None  . Transportation needs - medical: None  . Transportation needs - non-medical: None  Occupational History  . None  Tobacco Use  . Smoking status: Current Every Day Smoker    Packs/day: 0.50    Types: Cigarettes  . Smokeless tobacco: Never Used  Substance and Sexual Activity  . Alcohol use: No  . Drug use: Yes    Types: Cocaine, "Crack" cocaine  . Sexual activity: Not Currently  Other Topics Concern  . None  Social History Narrative  . None   Additional Social History:                         Sleep: Fair  Appetite:  Fair  Current Medications: Current Facility-Administered Medications  Medication Dose Route Frequency Provider Last Rate Last Dose  . acetaminophen (TYLENOL) tablet 650 mg  650 mg Oral Q6H PRN Jolene Schimke, MD      . alum & mag hydroxide-simeth (MAALOX/MYLANTA) 200-200-20 MG/5ML suspension 30 mL  30 mL Oral Q4H PRN Jolene Schimke, MD      . hydrOXYzine (ATARAX/VISTARIL) tablet 25 mg  25 mg Oral TID PRN Jolene Schimke, MD      . magnesium hydroxide (MILK OF MAGNESIA) suspension 30 mL  30 mL Oral Daily PRN Jolene Schimke, MD      . nicotine (NICODERM CQ -  dosed in mg/24 hours) patch 21 mg  21 mg Transdermal Daily Pucilowska, Jolanta B, MD      . OLANZapine (ZYPREXA) tablet 10 mg  10 mg Oral QHS Pucilowska, Jolanta B, MD   10 mg at 06/10/17 2139  . sertraline (ZOLOFT) tablet 100 mg  100 mg Oral Daily Pucilowska, Jolanta B, MD   100 mg at 06/11/17 0935  . traZODone (DESYREL) tablet 100 mg  100 mg Oral QHS Pucilowska, Jolanta B, MD        Lab Results:  Results for orders placed or performed during the hospital encounter of 06/09/17 (from the past 48 hour(s))  Comprehensive metabolic panel     Status: Abnormal   Collection Time: 06/10/17  6:39 AM  Result Value Ref Range    Sodium 138 135 - 145 mmol/L   Potassium 4.4 3.5 - 5.1 mmol/L   Chloride 105 101 - 111 mmol/L   CO2 25 22 - 32 mmol/L   Glucose, Bld 118 (H) 65 - 99 mg/dL   BUN 14 6 - 20 mg/dL   Creatinine, Ser 0.94 0.61 - 1.24 mg/dL   Calcium 9.3 8.9 - 10.3 mg/dL   Total Protein 7.1 6.5 - 8.1 g/dL   Albumin 3.9 3.5 - 5.0 g/dL   AST 16 15 - 41 U/L   ALT 10 (L) 17 - 63 U/L   Alkaline Phosphatase 56 38 - 126 U/L   Total Bilirubin 0.5 0.3 - 1.2 mg/dL   GFR calc non Af Amer >60 >60 mL/min   GFR calc Af Amer >60 >60 mL/min    Comment: (NOTE) The eGFR has been calculated using the CKD EPI equation. This calculation has not been validated in all clinical situations. eGFR's persistently <60 mL/min signify possible Chronic Kidney Disease.    Anion gap 8 5 - 15  Hemoglobin A1c     Status: Abnormal   Collection Time: 06/10/17  6:39 AM  Result Value Ref Range   Hgb A1c MFr Bld 5.8 (H) 4.8 - 5.6 %    Comment: (NOTE)         Prediabetes: 5.7 - 6.4         Diabetes: >6.4         Glycemic control for adults with diabetes: <7.0    Mean Plasma Glucose 120 mg/dL    Comment: (NOTE) Performed At: [facility] [place], [place] [id number] Rush Farmer MD [id number]   Lipid panel     Status: Abnormal   Collection Time: 06/10/17  6:39 AM  Result Value Ref Range   Cholesterol 176 0 - 200 mg/dL   Triglycerides 151 (H) <150 mg/dL   HDL 48 >40 mg/dL   Total CHOL/HDL Ratio 3.7 RATIO   VLDL 30 0 - 40 mg/dL   LDL Cholesterol 98 0 - 99 mg/dL    Comment:        Total Cholesterol/HDL:CHD Risk Coronary Heart Disease Risk Table                     Men   Women  1/2 Average Risk   3.4   3.3  Average Risk       5.0   4.4  2 X Average Risk   9.6   7.1  3 X Average Risk  23.4   11.0        Use the calculated Patient Ratio above and the CHD Risk Table to determine the patient's CHD Risk.  ATP III CLASSIFICATION (LDL):  <100     mg/dL   Optimal  100-129  mg/dL   Near or Above                     Optimal  130-159  mg/dL   Borderline  160-189  mg/dL   High  >190     mg/dL   Very High     Blood Alcohol level:  Lab Results  Component Value Date   ETH <10 06/08/2017   ETH <5 51/76/1607    Metabolic Disorder Labs: Lab Results  Component Value Date   HGBA1C 5.8 (H) 06/10/2017   MPG 120 06/10/2017   No results found for: PROLACTIN Lab Results  Component Value Date   CHOL 176 06/10/2017   TRIG 151 (H) 06/10/2017   HDL 48 06/10/2017   CHOLHDL 3.7 06/10/2017   VLDL 30 06/10/2017   LDLCALC 98 06/10/2017    Physical Findings: AIMS: Facial and Oral Movements Muscles of Facial Expression: None, normal Lips and Perioral Area: None, normal Jaw: None, normal Tongue: None, normal,Extremity Movements Upper (arms, wrists, hands, fingers): None, normal Lower (legs, knees, ankles, toes): None, normal, Trunk Movements Neck, shoulders, hips: None, normal, Overall Severity Severity of abnormal movements (highest score from questions above): None, normal Incapacitation due to abnormal movements: None, normal Patient's awareness of abnormal movements (rate only patient's report): No Awareness, Dental Status Current problems with teeth and/or dentures?: No Does patient usually wear dentures?: No  CIWA:    COWS:     Musculoskeletal: Strength & Muscle Tone: within normal limits Gait & Station: normal Patient leans: N/A  Psychiatric Specialty Exam: Physical Exam  Nursing note and vitals reviewed. Psychiatric: His speech is normal. Thought content normal. His affect is blunt. He is withdrawn. Cognition and memory are normal. He expresses impulsivity. He exhibits a depressed mood.    Review of Systems  Neurological: Negative.   Psychiatric/Behavioral: Positive for depression and substance abuse.  All other systems reviewed and are negative.   Blood pressure 113/75, pulse (!) 57, temperature 98.2 F (36.8 C), temperature source Oral, resp. rate 18, height '5\' 10"'$   (1.778 m), weight 68.5 kg (151 lb), SpO2 99 %.Body mass index is 21.67 kg/m.  General Appearance: Casual  Eye Contact:  Good  Speech:  Clear and Coherent  Volume:  Normal  Mood:  Depressed  Affect:  Appropriate  Thought Process:  Goal Directed and Descriptions of Associations: Intact  Orientation:  Full (Time, Place, and Person)  Thought Content:  WDL  Suicidal Thoughts:  No  Homicidal Thoughts:  No  Memory:  Immediate;   Fair Recent;   Fair Remote;   Fair  Judgement:  Poor  Insight:  Lacking  Psychomotor Activity:  Decreased  Concentration:  Concentration: Fair and Attention Span: Fair  Recall:  [employer] of Knowledge:  Fair  Language:  Fair  Akathisia:  No  Handed:  Right  AIMS (if indicated):     Assets:  Communication Skills Desire for Improvement Housing Physical Health Resilience Social Support  ADL's:  Intact  Cognition:  WNL  Sleep:  Number of Hours: 7.3     Treatment Plan Summary: Daily contact with patient to assess and evaluate symptoms and progress in treatment and Medication management   Russell Whitehead is a 41 year old male with a history of bipoler depression and substance abuse admitted for suicidal ideation in the context of treatment noncompliance and relapse on cocaine.  #Suicidal ideation -patient able  to contract for safety in the hospital  #Mood -continue Zoloft 100 mg daily -discontinue Zyprexa, patient refused  #Insomnia -discontinue Trazodone, patient refused  #Smoking cessation -Nicotine patch is available  #Substance abuse -desires residential treatment -referred to [facility]  #Metabolic syndrome monitoring -Lipid panel, TSH and HgbA1C are normal -EKG, QTc 384  #Disposition -TBE    Orson Slick, MD 06/11/2017, 11:59 AM

## 2017-06-11 NOTE — Tx Team (Signed)
Interdisciplinary Treatment and Diagnostic Plan Update  06/11/2017 Time of Session: 10:29 AM  Russell Whitehead MRN: [id number]  Principal Diagnosis: MDD (major depressive disorder), recurrent severe, without psychosis (HCC)  Secondary Diagnoses: Principal Problem:   MDD (major depressive disorder), recurrent severe, without psychosis (HCC) Active Problems:   Cocaine use disorder, severe, dependence (HCC)   Tobacco use disorder   Cocaine use disorder (HCC)   Cannabis use disorder, moderate, dependence (H[facility])   Current Medications:  Current Facility-Administered Medications  Medication Dose Route Frequency Provider Last Rate Last Dose  . acetaminophen (TYLENOL) tablet 650 mg  650 mg Oral Q6H PRN Cindee Lame, MD      . alum & mag hydroxide-simeth (MAALOX/MYLANTA) 200-200-20 MG/5ML suspension 30 mL  30 mL Oral Q4H PRN Cindee Lame, MD      . hydrOXYzine (ATARAX/VISTARIL) tablet 25 mg  25 mg Oral TID PRN Cindee Lame, MD      . magnesium hydroxide (MILK OF MAGNESIA) suspension 30 mL  30 mL Oral Daily PRN Cindee Lame, MD      . nicotine (NICODERM CQ - dosed in mg/24 hours) patch 21 mg  21 mg Transdermal Daily Pucilowska, Jolanta B, MD      . OLANZapine (ZYPREXA) tablet 10 mg  10 mg Oral QHS Pucilowska, Jolanta B, MD   10 mg at 06/10/17 2139  . sertraline (ZOLOFT) tablet 100 mg  100 mg Oral Daily Pucilowska, Jolanta B, MD   100 mg at 06/11/17 0935  . traZODone (DESYREL) tablet 100 mg  100 mg Oral QHS Pucilowska, Jolanta B, MD        PTA Medications: Medications Prior to Admission  Medication Sig Dispense Refill Last Dose  . hydrOXYzine (ATARAX/VISTARIL) 25 MG tablet Take 1 tablet (25 mg total) by mouth 3 (three) times daily as needed for anxiety. 30 tablet 0   . OLANZapine zydis (ZYPREXA) 5 MG disintegrating tablet Take 1 tablet (5 mg total) by mouth 3 (three) times daily as needed (severe anxiety/agitation). 30 tablet 0   . sertraline (ZOLOFT) 50 MG tablet Take 1  tablet (50 mg total) by mouth daily. 30 tablet 0   . traZODone (DESYREL) 50 MG tablet Take 1 tablet (50 mg total) by mouth at bedtime. 30 tablet 0     Patient Stressors: Financial difficulties Medication change or noncompliance Substance abuse  Patient Strengths: Ability for insight Average or above average intelligence Communication skills Supportive family/friends  Treatment Modalities: Medication Management, Group therapy, Case management,  1 to 1 session with clinician, Psychoeducation, Recreational therapy.   Physician Treatment Plan for Primary Diagnosis: MDD (major depressive disorder), recurrent severe, without psychosis (HCC) Long Term Goal(s): Improvement in symptoms so as ready for discharge  Short Term Goals: Ability to identify changes in lifestyle to reduce recurrence of condition will improve Ability to verbalize feelings will improve Ability to disclose and discuss suicidal ideas Ability to demonstrate self-control will improve Ability to identify and develop effective coping behaviors will improve Compliance with prescribed medications will improve Ability to identify triggers associated with substance abuse/mental health issues will improve Ability to identify changes in lifestyle to reduce recurrence of condition will improve Ability to demonstrate self-control will improve Ability to identify triggers associated with substance abuse/mental health issues will improve  Medication Management: Evaluate patient's response, side effects, and tolerance of medication regimen.  Therapeutic Interventions: 1 to 1 sessions, Unit Group sessions and Medication administration.  Evaluation of Outcomes: Progressing  Physician Treatment Plan for Secondary Diagnosis:  Principal Problem:   MDD (major depressive disorder), recurrent severe, without psychosis (HCC) Active Problems:   Cocaine use disorder, severe, dependence (HCC)   Tobacco use disorder   Cocaine use disorder  (HCC)   Cannabis use disorder, moderate, dependence (HCC)   Long Term Goal(s): Improvement in symptoms so as ready for discharge  Short Term Goals: Ability to identify changes in lifestyle to reduce recurrence of condition will improve Ability to verbalize feelings will improve Ability to disclose and discuss suicidal ideas Ability to demonstrate self-control will improve Ability to identify and develop effective coping behaviors will improve Compliance with prescribed medications will improve Ability to identify triggers associated with substance abuse/mental health issues will improve Ability to identify changes in lifestyle to reduce recurrence of condition will improve Ability to demonstrate self-control will improve Ability to identify triggers associated with substance abuse/mental health issues will improve  Medication Management: Evaluate patient's response, side effects, and tolerance of medication regimen.  Therapeutic Interventions: 1 to 1 sessions, Unit Group sessions and Medication administration.  Evaluation of Outcomes: Progressing   RN Treatment Plan for Primary Diagnosis: MDD (major depressive disorder), recurrent severe, without psychosis (HCC) Long Term Goal(s): Knowledge of disease and therapeutic regimen to maintain health will improve  Short Term Goals: Ability to identify and develop effective coping behaviors will improve and Compliance with prescribed medications will improve  Medication Management: RN will administer medications as ordered by provider, will assess and evaluate patient's response and provide education to patient for prescribed medication. RN will report any adverse and/or side effects to prescribing provider.  Therapeutic Interventions: 1 on 1 counseling sessions, Psychoeducation, Medication administration, Evaluate responses to treatment, Monitor vital signs and CBGs as ordered, Perform/monitor CIWA, COWS, AIMS and Fall Risk screenings as  ordered, Perform wound care treatments as ordered.  Evaluation of Outcomes: Progressing   LCSW Treatment Plan for Primary Diagnosis: MDD (major depressive disorder), recurrent severe, without psychosis (HCC) Long Term Goal(s): Safe transition to appropriate next level of care at discharge, Engage patient in therapeutic group addressing interpersonal concerns.  Short Term Goals: Engage patient in aftercare planning with referrals and resources, Identify triggers associated with mental health/substance abuse issues and Increase skills for wellness and recovery  Therapeutic Interventions: Assess for all discharge needs, 1 to 1 time with Social worker, Explore available resources and support systems, Assess for adequacy in community support network, Educate family and significant other(s) on suicide prevention, Complete Psychosocial Assessment, Interpersonal group therapy.  Evaluation of Outcomes: Progressing   Progress in Treatment: Attending groups: Yes Participating in groups: Yes Taking medication as prescribed: Yes Toleration medication: Yes, no side effects reported at this time Family/Significant other contact made: Yes, Lessie Thompson, mother PRosalyn Gessatient understands diagnosis: Yes AEB client identifying a need for medication to manage his symptoms.  Discussing patient identified problems/goals with staff: Yes Medical problems stabilized or resolved: Yes Denies suicidal/homicidal ideation: Yes Issues/concerns per patient self-inventory: None Other: N/A  New problem(s) identified: None identified at this time.   New Short Term/Long Term Goal(s): "To start my medications"   Discharge Plan or Barriers:   Reason for Continuation of Hospitalization: Medication stabilization   Estimated Length of Stay: 3-5 days   Recreational Therapy: Patient Stressors: Family, Relationship, Drugs Patient Goal: Patient will identify 3 negative effects of substance abuse x5  days.  Attendees: Patient:Russell Whitehead 06/11/2017  10:29 AM Physician: Kristine LineaJolanta Pucilowska, MD        06/11/2017  10:29 AM Nursing: Marjo Bickerhristy Richards, RN  06/11/2017  10:29 AM RN Care Manager:     06/11/2017  10:29 AM Social Worker: Johny ShearsCassandra Jarrett LCSWA           06/11/2017  10:29 AM Recreational Therapist: Danella DeisShay. Dreama SaaOutlaw CTRS, LRT        06/11/2017  10:29 AM Other:      06/11/2017  10:29 AM Other:   06/11/2017  10:29 AM   Scribe for Treatment Team: Johny Shearsassandra Jarrett LCSWA 06/11/2017 10:29 AM

## 2017-06-11 NOTE — [facility] Group Notes (Signed)
Goals Group Date/Time: 06/11/2017 9:00 AM Type of Therapy and Topic: Group Therapy: Goals Group: SMART Goals   Participation Level: Moderate  Description of Group:    The purpose of a daily goals group is to assist and guide patients in setting recovery/wellness-related goals. The objective is to set goals as they relate to the crisis in which they were admitted. Patients will be using SMART goal modalities to set measurable goals. Characteristics of realistic goals will be discussed and patients will be assisted in setting and processing how one will reach their goal. Facilitator will also assist patients in applying interventions and coping skills learned in psycho-education groups to the SMART goal and process how one will achieve defined goal.   Therapeutic Goals:   -Patients will develop and document one goal related to or their crisis in which brought them into treatment.  -Patients will be guided by LCSW using SMART goal setting modality in how to set a measurable, attainable, realistic and time sensitive goal.  -Patients will process barriers in reaching goal.  -Patients will process interventions in how to overcome and successful in reaching goal.   Patient's Goal: to discuss medications with my MD.  RN tried to give me medications that I knew nothing about last night and I said no.   Therapeutic Modalities:  Motivational Interviewing  Research officer, political partyCognitive Behavioral Therapy  Crisis Intervention Model  SMART goals setting   Daleen SquibbGreg Tressie Ragin, [place]CS[place]

## 2017-06-11 NOTE — Progress Notes (Signed)
Patient slept all night without disturbances, refused to take his medicine trazodone last night with no explanations, socializing well with peers and attended group with participations and assertive no distress noted.

## 2017-06-11 NOTE — Progress Notes (Signed)
Recreation Therapy Notes  Date: 12.04.2018  Time: 1:00pm  Location: Craft Room  Behavioral response: Appropriate  Intervention Topic: Goals  Discussion/Intervention: Group content on today was focused on goals. Patients described what goals are and how they define goals. Individuals expressed how they go about setting goals and reaching them. The group identified how important goals are and if they make short term goals to reach long term goals. Patients described how many goals they work on at a time and what affects them not reaching their goal. Individuals described how much time they put into planning and obtaining their goals. The group participated in the intervention "My Goal Board" and made personal goal boards to help them achieve their goal. Clinical Observations/Feedback:  Patient came to group and described goals can be  defined and clear so you know what you are working on. Individual identified goals should be do-able and a plan should be in place to obtain goals.He participated in the intervention and was social with his peers and staff during group.  Naia Ruff LRT/CTRS         Aunna Snooks 06/11/2017 2:13 PM

## 2017-06-11 NOTE — Progress Notes (Addendum)
CSW received auth number from [facility] for [facility] referral: [id number].  Referral made to [facility].  Admissions at [facility] reports they do have current beds.  CS[facility] and [facility] currently.  Pt would be first on wait list, however.  Please call back to place pt on wait list.  CS[facility].  Receptionist reports no beds today but unsure about later this week.  Garner NashGregory Khaleb Broz, MSW, LCSW Clinical Social Worker 06/11/2017 11:17 AM   CSW received call from [facility] that pt does not meet criteria for residential treatment at [facility] and should be referred to outpatient or residential treatment. Garner NashGregory Tarnisha Kachmar, MSW, LCSW Clinical Social Worker 06/11/2017 12:33 PM

## 2017-06-11 NOTE — Progress Notes (Signed)
CSW assisted pt in completing phone interview with [facility].  They have his paperwork and will call back with admission date.    CSW and pt contacted pt mother to discuss his staying with her for several days if no bed is available immediately.  Mother said this is not possible.  Pt spoke to his mother and she gave him a phone number for a friend who is a possible person he could stay with until bed opens up. Russell Whitehead, MSW, LCSW Clinical Social Worker 06/11/2017 3:38 PM

## 2017-06-11 NOTE — Progress Notes (Signed)
Received Russell Whitehead this am after breakfast and the community meeting. He was compliant with his medications and endorsed feeling anxious and depressed.  He denied feeling suicidal this am. He is attending the meeting and OOB in the milieu. No change in his status this PM.

## 2017-06-12 ENCOUNTER — Inpatient Hospital Stay: Payer: No Typology Code available for payment source

## 2017-06-12 MED ORDER — SERTRALINE HCL 100 MG PO TABS: 100.0000 mg | ORAL_TABLET | Freq: Every day | ORAL | 1 refills | Status: DC

## 2017-06-12 NOTE — Plan of Care (Signed)
  Progressing Education: Knowledge of [facility] Education information/materials will improve 06/12/2017 0156 - Progressing by Berkley Harveyeynolds, Tyee Vandevoorde I, RN

## 2017-06-12 NOTE — Progress Notes (Signed)
Pt was declined at [facility] due to his reporting that his use pattern is "binges", which does not meet their criteria.  They recommend outpt treatment.    CSW met with pt and we completed phone interviews at [facility] and at [facility] does not have a bed currently but pt is first on their wait list.  [facility] could take pt today.  Pt decided that he would prefer to wait for a bed at [facility] house, rather than go to [facility], who told him that they did not utilize [employer] as part of their treatment.    Pt reports he has no housing options but is OK to go to the homeless shelter until a bed opens up at [place] texted Joyce Gross at [employer] regarding pt coming potentially tomorrow.  Winferd Humphrey, MSW, LCSW Clinical Social Worker 06/12/2017 2:30 PM

## 2017-06-12 NOTE — Progress Notes (Signed)
D- Patient alert and oriented. Patient presents in a depressed mood stating that "I just want to sleep". Patient states that his depression is rated as a "7/10" and his anxiety level is rated as a "5/10" because "I'm just getting back on my meds and the other is my situation". When this writer asked patient about using his coping skills he stated "yes I know about all of that". Patient denies SI, HI, AVH, and pain at this time.  A- Scheduled medications administered to patient, per MD orders. Support and encouragement provided.  Routine safety checks conducted every 15 minutes.  Patient informed to notify staff with problems or concerns.  R- No adverse drug reactions noted. Patient contracts for safety at this time. Patient compliant with medications and treatment plan. Patient receptive, calm, and cooperative. Patient interacts well with others on the unit.  Patient remains safe at this time.

## 2017-06-12 NOTE — [facility] Group Notes (Signed)
[facility] Group Notes:  (Nursing/MHT/Case Management/Adjunct)  Date:  06/12/2017  Time:  1:55 AM  Type of Therapy:  Psychoeducational Skills  Participation Level:  Did Not Attend  Wilson SingerJustin  Ziyon Cedotal 06/12/2017, 1:55 AM

## 2017-06-12 NOTE — Progress Notes (Signed)
Recreation Therapy Notes   Date: 12.05.2018  Time: 3:00pm  Location: Craft room  Behavioral response: None  Group Type: Art/Craft  Participation level: None  Communication: None  Comments: Patient did not attend group.   Ly Bacchi LRT/CTRS        Annabella Elford 06/12/2017 4:25 PM

## 2017-06-12 NOTE — Progress Notes (Signed)
Recreation Therapy Notes  Date: 12.05.2018  Time: 1:00pm   Location: Craft Room  Behavioral response: Appropriate  Intervention Topic: Communication  Discussion/Intervention: Group content today was focused on communication. The group defined communication and ways to communicate with others. Individuals stated reason why communication is important and some reasons to communicate with others. Patients expressed if they thought they were good at communicating with others and ways they could improve their communication skills. The group identified important parts of communication and some experiences they have had in the past with communication. The group participated in the intervention "Put the story together", where they had a chance to test out their communication skills and identify ways to improve their communication techniques.  Clinical Observations/Feedback:  Patient came to group and stated he is verbal and non verbal sometimes when communicating with others. He identified he likes to take action. Individual participated in the intervention and was social with peers and staff during group.  Audrie Kuri LRT/CTRS         Kaegan Hettich 06/12/2017 1:57 PM

## 2017-06-12 NOTE — Plan of Care (Signed)
Patient presents in a depressed mood stating that "I just want to sleep". Patient verbalizes understanding of general information given to him at this time. Patient denies SI/HI/AVH at this time. Patient has remained free from injury on the unit at this time. Patient has been able to function at an adequate level. Patient remains safe on the unit at this time.

## 2017-06-12 NOTE — Progress Notes (Signed)
D: Pt stated he was feeling better. Pt had minimal interaction with writer, but pt was visible on unit earlier this evening.  A: Pt was offered support and encouragement. Pt was given scheduled medications. Pt was encourage to attend groups. Q 15 minute checks were done for safety.  R:Pt attends groups and interacts well with peers and staff. Pt is taking medication. Pt has no complaints.Pt receptive to treatment and safety maintained on unit.

## 2017-06-12 NOTE — Progress Notes (Signed)
D: Patient denies SI/HI/AVH. Patient presents as flat and uncooperative during assessment. Patient has no physical complaints at this time. Patient refused scheduled medications stated "if its not zoloft I don't want it."  A: Patient was assessed by this nurse. Q x 15 minute observation checks were completed for safety.  Patient care plan was reviewed. Patient was offered support and encouragement. Patient was encourage to attend groups, participate in unit activities and continue with plan of care.   R: Patient has no complaints of pain at this time. Patient safety maintained on unit. Patient had an estimated 7 hours 30 minutes of restful sleep.

## 2017-06-12 NOTE — [facility] Group Notes (Signed)
[facility] Group Notes:  (Nursing/MHT/Case Management/Adjunct)  Date:  06/12/2017  Time:  9:53 PM  Type of Therapy:  Group Therapy  Participation Level:  Active  Participation Quality:  Appropriate  Affect:  Appropriate  Cognitive:  Alert  Insight:  Appropriate  Engagement in Group:  Engaged  Modes of Intervention:  Support  Summary of Progress/Problems:  Russell Whitehead 06/12/2017, 9:53 PM

## 2017-06-12 NOTE — [facility] Group Notes (Signed)
  06/12/2017  Time: 0930  Type of Therapy/Topic:  Group Therapy:  Emotion Regulation  Participation Level:  Did Not Attend   Description of Group:    The purpose of this group is to assist patients in learning to regulate negative emotions and experience positive emotions. Patients will be guided to discuss ways in which they have been vulnerable to their negative emotions. These vulnerabilities will be juxtaposed with experiences of positive emotions or situations, and patients will be challenged to use positive emotions to combat negative ones. Special emphasis will be placed on coping with negative emotions in conflict situations, and patients will process healthy conflict resolution skills.  Therapeutic Goals: 1. Patient will identify two positive emotions or experiences to reflect on in order to balance out negative emotions 2. Patient will label two or more emotions that they find the most difficult to experience 3. Patient will demonstrate positive conflict resolution skills through discussion and/or role plays  Summary of Patient Progress: Pt was invited to attend group but chose not to attend. CSW will continue to encourage pt to attend group throughout their admission.       Therapeutic Modalities:   Cognitive Behavioral Therapy Feelings Identification Dialectical Behavioral Therapy   Blaize Nipper, MSW, LCSW 06/12/2017 10:13 AM  

## 2017-06-12 NOTE — Progress Notes (Signed)
CH rounding on unit. Patient order requests for visit. CH went by patients room and patient was resting. CH spoke with medical staff. CH will attempt another visit at a later time.   06/12/17 1000  Clinical Encounter Type  Visited With Patient not available;Health care provider  Visit Type Initial;Spiritual support;Behavioral Health;Other (Comment)  Referral From Nurse

## 2017-06-12 NOTE — Progress Notes (Signed)
[facility]  06/12/2017 3:30 PM Russell Whitehead  MRN:  [id number]  Subjective:   Russell Whitehead feels better. He is no longer suicidal. He refuses all medications except for the Zoloft. He was able to participate in discharge planning.  Treatment plan. Discontinue Zyprexa and Trazodone. Continue Zoloft.  Social/Disposition. Discharge tomorrow to [facility] if bed available or homeless shelter.  Principal Problem: MDD (major depressive disorder), recurrent severe, without psychosis (HCC) Diagnosis:   Patient Active Problem List   Diagnosis Date Noted  . Cocaine use disorder (HCC) [F14.10] 06/09/2017  . Cannabis use disorder, moderate, dependence (HCC) [F12.20] 06/09/2017  . Cocaine use disorder, severe, dependence (HCC) [F14.20] 09/16/2016  . Tobacco use disorder [F17.200] 09/16/2016  . MDD (major depressive disorder), recurrent severe, without psychosis (HCC) [F33.2] 09/16/2016  . 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  Past Medical History: History reviewed. No pertinent past medical history.  Past Surgical History:  Procedure Laterality Date  . HERNIA REPAIR     Family History:  Family History  Problem Relation Age of Onset  . Diabetes Mother   . Depression Father    Family Psychiatric  History: father with depression. Social History:  Social History   Substance and Sexual Activity  Alcohol Use No     Social History   Substance and Sexual Activity  Drug Use Yes  . Types: Cocaine, "Crack" cocaine    Social History   Socioeconomic History  . Marital status: Single    Spouse name: None  . Number of children: None  . Years of education: None  . Highest education level: None  Social Needs  . Financial resource strain: None  . Food insecurity - worry: None  . Food insecurity - inability: None  . Transportation needs - medical: None   . Transportation needs - non-medical: None  Occupational History  . None  Tobacco Use  . Smoking status: Current Every Day Smoker    Packs/day: 0.50    Types: Cigarettes  . Smokeless tobacco: Never Used  Substance and Sexual Activity  . Alcohol use: No  . Drug use: Yes    Types: Cocaine, "Crack" cocaine  . Sexual activity: Not Currently  Other Topics Concern  . None  Social History Narrative  . None   Additional Social History:                         Sleep: Fair  Appetite:  Fair  Current Medications: Current Facility-Administered Medications  Medication Dose Route Frequency Provider Last Rate Last Dose  . acetaminophen (TYLENOL) tablet 650 mg  650 mg Oral Q6H PRN Cindee LameIsbell, Lauren M, MD      . alum & mag hydroxide-simeth (MAALOX/MYLANTA) 200-200-20 MG/5ML suspension 30 mL  30 mL Oral Q4H PRN Cindee LameIsbell, Lauren M, MD      . hydrOXYzine (ATARAX/VISTARIL) tablet 25 mg  25 mg Oral TID PRN Cindee LameIsbell, Lauren M, MD      . magnesium hydroxide (MILK OF MAGNESIA) suspension 30 mL  30 mL Oral Daily PRN Cindee LameIsbell, Lauren M, MD      . nicotine (NICODERM CQ - dosed in mg/24 hours) patch 21 mg  21 mg Transdermal Daily Khalessi Blough B, MD      . sertraline (ZOLOFT) tablet 100 mg  100 mg Oral Daily Colbi Schiltz B, MD   100 mg at 06/12/17 [phone]  Lab Results: No results found for this or any previous visit (from the past 48 hour(s)).  Blood Alcohol level:  Lab Results  Component Value Date   ETH <10 06/08/2017   ETH <5 09/14/2016    Metabolic Disorder Labs: Lab Results  Component Value Date   HGBA1C 5.8 (H) 06/10/2017   MPG 120 06/10/2017   No results found for: PROLACTIN Lab Results  Component Value Date   CHOL 176 06/10/2017   TRIG 151 (H) 06/10/2017   HDL 48 06/10/2017   CHOLHDL 3.7 06/10/2017   VLDL 30 06/10/2017   LDLCALC 98 06/10/2017    Physical Findings: AIMS: Facial and Oral Movements Muscles of Facial Expression: None, normal Lips and Perioral  Area: None, normal Jaw: None, normal Tongue: None, normal,Extremity Movements Upper (arms, wrists, hands, fingers): None, normal Lower (legs, knees, ankles, toes): None, normal, Trunk Movements Neck, shoulders, hips: None, normal, Overall Severity Severity of abnormal movements (highest score from questions above): None, normal Incapacitation due to abnormal movements: None, normal Patient's awareness of abnormal movements (rate only patient's report): No Awareness, Dental Status Current problems with teeth and/or dentures?: No Does patient usually wear dentures?: No  CIWA:    COWS:     Musculoskeletal: Strength & Muscle Tone: within normal limits Gait & Station: normal Patient leans: N/A  Psychiatric Specialty Exam: Physical Exam  Nursing note and vitals reviewed. Psychiatric: He has a normal mood and affect. His speech is normal and behavior is normal. Thought content normal. Cognition and memory are normal. He expresses impulsivity.    Review of Systems  Neurological: Negative.   Psychiatric/Behavioral: Positive for substance abuse.  All other systems reviewed and are negative.   Blood pressure 123/73, pulse (!) 53, temperature 97.7 F (36.5 C), resp. rate 18, height 5\' 10"  (1.778 m), weight 68.5 kg (151 lb), SpO2 99 %.Body mass index is 21.67 kg/m.  General Appearance: Casual  Eye Contact:  Good  Speech:  Clear and Coherent  Volume:  Normal  Mood:  Anxious  Affect:  Appropriate  Thought Process:  Goal Directed and Descriptions of Associations: Intact  Orientation:  Full (Time, Place, and Person)  Thought Content:  WDL  Suicidal Thoughts:  No  Homicidal Thoughts:  No  Memory:  Immediate;   Fair Recent;   Fair Remote;   Fair  Judgement:  Poor  Insight:  Lacking  Psychomotor Activity:  Normal  Concentration:  Concentration: Fair and Attention Span: Fair  Recall:  [employer] of Knowledge:  Fair  Language:  Fair  Akathisia:  No  Handed:  Right  AIMS (if  indicated):     Assets:  Communication Skills Desire for Improvement Physical Health Resilience Social Support  ADL's:  Intact  Cognition:  WNL  Sleep:  Number of Hours: 7.3     Treatment Plan Summary: Daily contact with patient to assess and evaluate symptoms and progress in treatment and Medication management   Russell Whitehead is a 40 year old male with a history of bipoler depression and substance abuse admitted for suicidal ideation in the context of treatment noncompliance and relapse on cocaine.  #Suicidal ideation, resolved -patient able to contract for safety in the hospital  #Mood -continue Zoloft 100 mg daily -discontinue Zyprexa, patient refused  #Insomnia -discontinue Trazodone, patient refused  #Smoking cessation -Nicotine patch is available  #Substance abuse -desires residential treatment -[facility] or [facility] refused admission -possible bed at [facility] tomorrow -chest X-ray pending  #Metabolic syndrome monitoring -Lipid panel, TSH and HgbA1C are normal -  EKG, QTc 384  #Disposition -[facility]BE     Kristine LineaJolanta Jesus Poplin, MD 06/12/2017, 3:30 PM

## 2017-06-13 NOTE — Progress Notes (Signed)
D:  Pt calm and cooperative. Denies SI/HI and AVH.    A: Pt offered emotional support.  Scheduled medications administered. 15 minute checks in progress for safety.   R:  Pt complaint with medication. Pt seen interacting with peers and staff.

## 2017-06-13 NOTE — [facility] Group Notes (Signed)
[facility] LCSW Group Therapy Note  Date/Time: 06/13/17, 0930  Type of Therapy/Topic:  Group Therapy:  Balance in Life  Participation Level:  minimal  Description of Group:    This group will address the concept of balance and how it feels and looks when one is unbalanced. Patients will be encouraged to process areas in their lives that are out of balance, and identify reasons for remaining unbalanced. Facilitators will guide patients utilizing problem- solving interventions to address and correct the stressor making their life unbalanced. Understanding and applying boundaries will be explored and addressed for obtaining  and maintaining a balanced life. Patients will be encouraged to explore ways to assertively make their unbalanced needs known to significant others in their lives, using other group members and facilitator for support and feedback.  Therapeutic Goals: 1. Patient will identify two or more emotions or situations they have that consume much of in their lives. 2. Patient will identify signs/triggers that life has become out of balance:  3. Patient will identify two ways to set boundaries in order to achieve balance in their lives:  4. Patient will demonstrate ability to communicate their needs through discussion and/or role plays  Summary of Patient Progress: PT said little during group.  Stated "all of them" when asked which areas are out of balance in his own life.  Pt did participate some in discussion regarding how to try to move things back in to balance one step at a time.          Therapeutic Modalities:   Cognitive Behavioral Therapy Solution-Focused Therapy Assertiveness Training  Daleen SquibbGreg Malaia Buchta, [place]CS[place]

## 2017-06-13 NOTE — Discharge Summary (Signed)
Patient discharged home with his mother. Pt reviewed and signed for discharge paperwork and prescriptions. Medications and all belongings returned to patient. Patient denies SI/HI and AVH.

## 2017-06-13 NOTE — Progress Notes (Signed)
Recreation Therapy Notes   Date: 12.06.2018  Time: 1:00pm  Location: Craft Room  Behavioral response: N/A  Intervention Topic: Teamwork  Discussion/Intervention: Patient did not attend group.  Clinical Observations/Feedback:  Patient did not attend group.  Abhishek Levesque LRT/CTRS         Jamayah Myszka 06/13/2017 1:59 PM 

## 2017-06-13 NOTE — Discharge Summary (Signed)
Physician Discharge Summary Note  Patient:  Russell Whitehead is an 41 y.o., male MRN:  [id number] DOB:  [birth year] Patient phone:  [phone] (home)  Patient address:   [address] [place] [address],  Total Time spent with patient: 30 minutes  Date of Admission:  06/09/2017 Date of Discharge: 06/13/2017  Reason for Admission:  Suicidal ideation.  Identifying data. Russell Whitehead is a 41 year old male with a history of mood instability and substance abuse.  Chief complaint. "I am trying to get better."  History of present illness. Information was obtained from the patient and the chart. The patient came to the ER with his NA sponsor complaining of suicidal ideation. He has not been compliant with treatment and relapsed on cocaine for the past two days. He reports poor sleep, decreased appetite, anhedonia, poor energy and concentration for the past several months. He denies psychotic symptoms. He reports infrequent panic attacks, nightmares and flashbacks of PTSD and OCD symptoms with cleaning and organizing. He uses cocaine, no alcohol or other drugs.  Past psychiatric history. This is his 6th psychiatric hospitalization for depression or substances. He denies ever attempting suicide. He went to rehab several times, last time at and remained sober for 5 months. His longest sobriety was 3 years in 2014.  Family psychiatric history. Father with depression.  Social history. He lives with his mother, goes to NA.  Principal Problem: MDD (major depressive disorder), recurrent severe, without psychosis [facility]) Discharge Diagnoses: Patient Active Problem List   Diagnosis Date Noted  . MDD (major depressive disorder), recurrent severe, without psychosis (HCC) [F33.2] 09/16/2016    Priority: High  . Cocaine use disorder (HCC) [F14.10] 06/09/2017  . Cannabis use disorder, moderate, dependence (HCC) [F12.20] 06/09/2017  . Cocaine use disorder, severe, dependence (HCC) [F14.20] 09/16/2016   . Tobacco use disorder [F17.200] 09/16/2016  . Substance-induced anxiety disorder [facility]) [F19.980] 09/16/2016    Past Medical History: History reviewed. No pertinent past medical history.  Past Surgical History:  Procedure Laterality Date  . HERNIA REPAIR     Family History:  Family History  Problem Relation Age of Onset  . Diabetes Mother   . Depression Father    Social History:  Social History   Substance and Sexual Activity  Alcohol Use No     Social History   Substance and Sexual Activity  Drug Use Yes  . Types: Cocaine, "Crack" cocaine    Social History   Socioeconomic History  . Marital status: Single    Spouse name: None  . Number of children: None  . Years of education: None  . Highest education level: None  Social Needs  . Financial resource strain: None  . Food insecurity - worry: None  . Food insecurity - inability: None  . Transportation needs - medical: None  . Transportation needs - non-medical: None  Occupational History  . None  Tobacco Use  . Smoking status: Current Every Day Smoker    Packs/day: 0.50    Types: Cigarettes  . Smokeless tobacco: Never Used  Substance and Sexual Activity  . Alcohol use: No  . Drug use: Yes    Types: Cocaine, "Crack" cocaine  . Sexual activity: Not Currently  Other Topics Concern  . None  Social History Narrative  . None    Hospital Course:    Russell Whitehead is a 41 year old male with a history of depression and substance abuse admitted for suicidal ideation in the context of treatment noncompliance and relapse on cocaine.  #  Suicidal ideation, resolved -patient able to contract for safety in the hospital -he is forward thinking and more optimistic about the future  #Mood, improved -continue Zoloft 100 mg daily -patient refused any other medications  #Smoking cessation -Nicotine patch is available  #Substance abuse -desires residential treatment -[facility] or [facility] refused admission -[facility] -chest X-ray negative for TB  #Metabolic syndrome monitoring -Lipid panel, TSH and HgbA1C arenormal -EKG,QTc 384  #Disposition -patient no longer may stay with his mother -discharge to the homeless shelter while [facility] -follow up with [facility]  Physical Findings: AIMS: Facial and Oral Movements Muscles of Facial Expression: None, normal Lips and Perioral Area: None, normal Jaw: None, normal Tongue: None, normal,Extremity Movements Upper (arms, wrists, hands, fingers): None, normal Lower (legs, knees, ankles, toes): None, normal, Trunk Movements Neck, shoulders, hips: None, normal, Overall Severity Severity of abnormal movements (highest score from questions above): None, normal Incapacitation due to abnormal movements: None, normal Patient's awareness of abnormal movements (rate only patient's report): No Awareness, Dental Status Current problems with teeth and/or dentures?: No Does patient usually wear dentures?: No  CIWA:    COWS:     Musculoskeletal: Strength & Muscle Tone: within normal limits Gait & Station: normal Patient leans: N/A  Psychiatric Specialty Exam: Physical Exam  Nursing note and vitals reviewed. Psychiatric: He has a normal mood and affect. His speech is normal and behavior is normal. Thought content normal. Cognition and memory are normal. He expresses impulsivity.    Review of Systems  Neurological: Negative.   Psychiatric/Behavioral: Positive for substance abuse.  All other systems reviewed and are negative.   Blood pressure 94/70, pulse (!) 54, temperature 98 F (36.7 C), resp. rate 18, height 5\' 10"  (1.778 m), weight 68.5 kg (151 lb), SpO2 99 %.Body mass index is 21.67 kg/m.  General Appearance: Casual  Eye Contact:  Good  Speech:  Clear and Coherent  Volume:  Normal  Mood:  Euthymic  Affect:  Appropriate  Thought Process:  Goal Directed and Descriptions of Associations: Intact  Orientation:  Full (Time,  Place, and Person)  Thought Content:  WDL  Suicidal Thoughts:  No  Homicidal Thoughts:  No  Memory:  Immediate;   Fair Recent;   Fair Remote;   Fair  Judgement:  Poor  Insight:  Shallow  Psychomotor Activity:  Normal  Concentration:  Concentration: Fair and Attention Span: Fair  Recall:  FiservFair  Fund of Knowledge:  Fair  Language:  Fair  Akathisia:  No  Handed:  Right  AIMS (if indicated):     Assets:  Communication Skills Desire for Improvement Physical Health Resilience Social Support  ADL's:  Intact  Cognition:  WNL  Sleep:  Number of Hours: 7.15        Has this patient used any form of tobacco in the last 30 days? (Cigarettes, Smokeless Tobacco, Cigars, and/or Pipes) Yes, Yes, A prescription for an FDA-approved tobacco cessation medication was offered at discharge and the patient refused  Blood Alcohol level:  Lab Results  Component Value Date   [facility] 06/08/2017   ETH <5 09/14/2016    Metabolic Disorder Labs:  Lab Results  Component Value Date   HGBA1C 5.8 (H) 06/10/2017   MPG 120 06/10/2017   No results found for: PROLACTIN Lab Results  Component Value Date   CHOL 176 06/10/2017   TRIG 151 (H) 06/10/2017   HDL 48 06/10/2017   CHOLHDL 3.7 06/10/2017   VLDL 30 06/10/2017  LDLCALC 98 06/10/2017    See Psychiatric Specialty Exam and Suicide Risk Assessment completed by Attending Physician prior to discharge.  Discharge destination:  Other:  homeless shelter  Is patient on multiple antipsychotic therapies at discharge:  No   Has Patient had three or more failed trials of antipsychotic monotherapy by history:  No  Recommended Plan for Multiple Antipsychotic Therapies: NA  Discharge Instructions    Diet - low sodium heart healthy   Complete by:  As directed    Increase activity slowly   Complete by:  As directed      Allergies as of 06/13/2017   No Known Allergies     Medication List    STOP taking these medications   hydrOXYzine 25 MG  tablet Commonly known as:  ATARAX/VISTARIL   OLANZapine zydis 5 MG disintegrating tablet Commonly known as:  ZYPREXA   traZODone 50 MG tablet Commonly known as:  DESYREL     TAKE these medications     Indication  sertraline 100 MG tablet Commonly known as:  ZOLOFT Take 1 tablet (100 mg total) by mouth daily. What changed:    medication strength  how much to take  Indication:  Major Depressive Disorder      Follow-up Information    [facility] Go on 06/14/2017.   Why:  [facility] on Friday, 06/14/17, at 12:30pm.  Please bring a copy of your hospital discharge paperwork. Contact information: [address] [place] [address] [phone]        [facility] Follow up.   Contact information: [address] [place] [address] [phone]           Follow-up recommendations:  Activity:  as tolerated Diet:  low sodium heart healthy Other:  keep follow up appointment  Comments:     Signed: Kristine LineaJolanta Merrin Mcvicker, MD 06/13/2017, 9:26 AM

## 2017-06-13 NOTE — Progress Notes (Signed)
  [facility] :  Will you be returning to the same living situation after discharge:  No. [facility] shelter At discharge, do you have transportation home?: Yes,  friend Do you have the ability to pay for your medications: No. Medication management referral made.  Release of information consent forms completed and in the chart;  Patient's signature needed at discharge.  Patient to Follow up at: Follow-up Information    [facility] Go on 06/14/2017.   Why:  [facility] on Friday, 06/14/17, at 12:30pm.   Please maintain daily contact with Unk PintoHarvey Bryant, Peer Support Services, [phone], for assistance with [facility]. Contact information: [address] [place] [phone]        [facility] Follow up.   Why:  Please call [facility] each day to check if a bed has opened up.  You are the first person on the waiting list. Contact information: [address] [place] [address] [phone]           Next level of care provider has access to [facility] Link:no  Safety Planning and Suicide Prevention discussed: Yes,  with mother     Has patient been referred to the Quitline?: Patient refused referral  Patient has been referred for addiction treatment: Yes  Lorri FrederickWierda, Augusta Hilbert Jon, LCSW 06/13/2017, 1:57 PM

## 2017-06-13 NOTE — BHH Group Notes (Signed)
  06/13/2017 9am  Type of Therapy and Topic: Group Therapy: Goals Group: SMART Goals   Participation Level: Did Not Attend  Description of Group:    The purpose of a daily goals group is to assist and guide patients in setting recovery/wellness-related goals. The objective is to set goals as they relate to the crisis in which they were admitted. Patients will be using SMART goal modalities to set measurable goals. Characteristics of realistic goals will be discussed and patients will be assisted in setting and processing how one will reach their goal. Facilitator will also assist patients in applying interventions and coping skills learned in psycho-education groups to the SMART goal and process how one will achieve defined goal.   Therapeutic Goals:   -Patients will develop and document one goal related to or their crisis in which brought them into treatment.  -Patients will be guided by LCSW using SMART goal setting modality in how to set a measurable, attainable, realistic and time sensitive goal.  -Patients will process barriers in reaching goal.  -Patients will process interventions in how to overcome and successful in reaching goal.   Patient's Goal: Did not attend  Therapeutic Modalities:  Motivational Interviewing  Cognitive Behavioral Therapy  Crisis Intervention Model  SMART goals setting  Abbagayle Zaragoza, LCSW 06/13/2017 10:40 AM  

## 2017-06-13 NOTE — Progress Notes (Signed)
[facility]  The admission did come today.  They did receive the referral paperwork and will call shelter and Lorella NimrodHarvey when the bed opens up. Garner NashGregory Xaviera Flaten, MSW, LCSW Clinical Social Worker 06/13/2017 2:17 PM

## 2017-06-13 NOTE — Progress Notes (Signed)
Recreation Therapy Notes  INPATIENT RECREATION TR PLAN  Patient Details Name: Russell Whitehead MRN: [id number] DOB: 03-Jul-1976 Today's Date: 06/13/2017  Rec Therapy Plan Is patient appropriate for Therapeutic Recreation?: Yes Treatment times per week: at least 3 Estimated Length of Stay: 5-7 days TR Treatment/Interventions: Group participation (Comment)(Appropriate participation in recreation therapy tx. )  Discharge Criteria Pt will be discharged from therapy if:: Discharged Treatment plan/goals/alternatives discussed and agreed upon by:: Patient/family  Discharge Summary Short term goals set: Patient will identify 3 negative effects of substance abuse x5 days.  Short term goals met: Complete Progress toward goals comments: Groups attended Which groups?: Goal setting, Communication, Other (Comment)(Emotions) Reason goals not met: N/A Therapeutic equipment acquired: N/A Reason patient discharged from therapy: Discharge from hospital Pt/family agrees with progress & goals achieved: Yes Date patient discharged from therapy: 06/13/17   Lleyton Byers 06/13/2017, 4:51 PM

## 2017-06-13 NOTE — BHH Suicide Risk Assessment (Signed)
[facility]   Principal Problem: MDD (major depressive disorder), recurrent severe, without psychosis (HCC) Discharge Diagnoses:  Patient Active Problem List   Diagnosis Date Noted  . MDD (major depressive disorder), recurrent severe, without psychosis (HCC) [F33.2] 09/16/2016    Priority: High  . Cocaine use disorder (HCC) [F14.10] 06/09/2017  . Cannabis use disorder, moderate, dependence (HCC) [F12.20] 06/09/2017  . Cocaine use disorder, severe, dependence (HCC) [F14.20] 09/16/2016  . Tobacco use disorder [F17.200] 09/16/2016  . Substance-induced anxiety disorder [facility](HCC) [F19.980] 09/16/2016    Total Time spent with patient: 30 minutes  Musculoskeletal: Strength & Muscle Tone: within normal limits Gait & Station: normal Patient leans: N/A  Psychiatric Specialty Exam: Review of Systems  Neurological: Negative.   Psychiatric/Behavioral: Positive for substance abuse.  All other systems reviewed and are negative.   Blood pressure 94/70, pulse (!) 54, temperature 98 F (36.7 C), resp. rate 18, height 5\' 10"  (1.778 m), weight 68.5 kg (151 lb), SpO2 99 %.Body mass index is 21.67 kg/m.  General Appearance: Casual  Eye Contact::  Good  Speech:  Clear and Coherent409  Volume:  Normal  Mood:  Euthymic  Affect:  Appropriate  Thought Process:  Goal Directed and Descriptions of Associations: Intact  Orientation:  Full (Time, Place, and Person)  Thought Content:  WDL  Suicidal Thoughts:  No  Homicidal Thoughts:  No  Memory:  Immediate;   Fair Recent;   Fair Remote;   Fair  Judgement:  Poor  Insight:  Shallow  Psychomotor Activity:  Normal  Concentration:  Fair  Recall:  FiservFair  Fund of Knowledge:Fair  Language: Fair  Akathisia:  No  Handed:  Right  AIMS (if indicated):     Assets:  Communication Skills Desire for Improvement Physical Health Resilience Social Support  Sleep:  Number of Hours: 7.15  Cognition: WNL  ADL's:  Intact   Mental Status  Per Nursing Assessment::   On Admission:     Demographic Factors:  Male, Low socioeconomic status and Unemployed  Loss Factors: Financial problems/change in socioeconomic status  Historical Factors: Prior suicide attempts, Family history of mental illness or substance abuse and Impulsivity  Risk Reduction Factors:   Sense of responsibility to family and Positive social support  Continued Clinical Symptoms:  Depression:   Comorbid alcohol abuse/dependence Impulsivity Alcohol/Substance Abuse/Dependencies  Cognitive Features That Contribute To Risk:  None    Suicide Risk:  Minimal: No identifiable suicidal ideation.  Patients presenting with no risk factors but with morbid ruminations; may be classified as minimal risk based on the severity of the depressive symptoms  Follow-up Information    [facility] Go on 06/14/2017.   Why:  [facility] Friday, 06/14/17, at 12:30pm.  Please bring a copy of your hospital discharge paperwork. Contact information: [address] [place] [address] [phone]        [facility] Follow up.   Contact information: [address] [place] [address] [phone]           Plan Of Care/Follow-up recommendations:  Activity:  as tolerated Diet:  low sodium heart healthy Other:  keep follow up appointments  Kristine LineaJolanta Pucilowska, MD 06/13/2017, 9:23 AM

## 2017-07-15 DIAGNOSIS — Z139 Encounter for screening, unspecified: Secondary | ICD-10-CM

## 2017-07-15 LAB — GLUCOSE, POCT (MANUAL RESULT ENTRY): POC GLUCOSE: 128 mg/dL — AB (ref 70–99)

## 2017-07-15 NOTE — Congregational Nurse Program (Signed)
Congregational Nurse Program Note  Date of Encounter: 07/15/2017  Past Medical History: No past medical history on file.  Encounter Details: CNP Questionnaire - 07/15/17 1459      Questionnaire   Patient Status  Not Applicable    Race  Black or African American    [facility]    Insurance  Not Applicable    Uninsured  Uninsured (NEW 1x/quarter)    Food  No food insecurities    Housing/Utilities  Yes, have permanent housing    Transportation  Yes, need transportation assistance    Interpersonal Safety  Yes, feel physically and emotionally safe where you currently live    Medication  No medication insecurities    Medical Provider  No    Referrals  Primary Care Provider/Clinic    ED Visit Averted  Not Applicable    Life-Saving Intervention Made  Not Applicable      [facility]. He has been there a short time. [facility] and was last seen 07/08/17 and states next appointment is coming up this month. He is currently taking Zoloft 100 mg once daily.  Past Medical History: Depression Anxiety Cocaine abuse  Past Surgical History Hernia repair 2016  Alert and oriented to person and place and time. Appears anxious. Does not maintain eye contact. Client denies chest pains or shortness of breath. States he is "healthy as a horse". States he really has no health issues, but needs a provider while [facility]. He states concerns regarding purchasing medications, but currently is on zoloft. Discussed with client options regarding medication assistance if needed.   [facility]. Appointment secured for Jan 15th at 1:30 pm.  Will follow up as needed. Appointment card and contact information given to client.

## 2017-07-23 ENCOUNTER — Encounter: Payer: Self-pay | Admitting: Physician Assistant

## 2017-07-23 ENCOUNTER — Ambulatory Visit: Payer: Self-pay | Admitting: Physician Assistant

## 2017-07-23 VITALS — BP 102/70 | HR 80 | Temp 98.2°F | Ht 69.0 in | Wt 165.0 lb

## 2017-07-23 DIAGNOSIS — R7303 Prediabetes: Secondary | ICD-10-CM | POA: Insufficient documentation

## 2017-07-23 DIAGNOSIS — F329 Major depressive disorder, single episode, unspecified: Secondary | ICD-10-CM

## 2017-07-23 DIAGNOSIS — F32A Depression, unspecified: Secondary | ICD-10-CM

## 2017-07-23 DIAGNOSIS — F17210 Nicotine dependence, cigarettes, uncomplicated: Secondary | ICD-10-CM

## 2017-07-23 DIAGNOSIS — F1910 Other psychoactive substance abuse, uncomplicated: Secondary | ICD-10-CM

## 2017-07-23 NOTE — Patient Instructions (Signed)
Prediabetes Prediabetes is the condition of having a blood sugar (blood glucose) level that is higher than it should be, but not high enough for you to be diagnosed with type 2 diabetes. Having prediabetes puts you at risk for developing type 2 diabetes (type 2 diabetes mellitus). Prediabetes may be called impaired glucose tolerance or impaired fasting glucose. Prediabetes usually does not cause symptoms. Your health care provider can diagnose this condition with blood tests. You may be tested for prediabetes if you are overweight and if you have at least one other risk factor for prediabetes. Risk factors for prediabetes include:  Having a family member with type 2 diabetes.  Being overweight or obese.  Being older than age 45.  Being of American-Indian, African-American, Hispanic/Latino, or Asian/Pacific Islander descent.  Having an inactive (sedentary) lifestyle.  Having a history of gestational diabetes or polycystic ovarian syndrome (PCOS).  Having low levels of good cholesterol (HDL-C) or high levels of blood fats (triglycerides).  Having high blood pressure.  What is blood glucose and how is blood glucose measured?  Blood glucose refers to the amount of glucose in your bloodstream. Glucose comes from eating foods that contain sugars and starches (carbohydrates) that the body breaks down into glucose. Your blood glucose level may be measured in mg/dL (milligrams per deciliter) or mmol/L (millimoles per liter).Your blood glucose may be checked with one or more of the following blood tests:  A fasting blood glucose (FBG) test. You will not be allowed to eat (you will fast) for at least 8 hours before a blood sample is taken. ? A normal range for FBG is 70-100 mg/dl (3.9-5.6 mmol/L).  An A1c (hemoglobin A1c) blood test. This test provides information about blood glucose control over the previous 2?3months.  An oral glucose tolerance test (OGTT). This test measures your blood  glucose twice: ? After fasting. This is your baseline level. ? Two hours after you drink a beverage that contains glucose.  You may be diagnosed with prediabetes:  If your FBG is 100?125 mg/dL (5.6-6.9 mmol/L).  If your A1c level is 5.7?6.4%.  If your OGGT result is 140?199 mg/dL (7.8-11 mmol/L).  These blood tests may be repeated to confirm your diagnosis. What happens if blood glucose is too high? The pancreas produces a hormone (insulin) that helps move glucose from the bloodstream into cells. When cells in the body do not respond properly to insulin that the body makes (insulin resistance), excess glucose builds up in the blood instead of going into cells. As a result, high blood glucose (hyperglycemia) can develop, which can cause many complications. This is a symptom of prediabetes. What can happen if blood glucose stays higher than normal for a long time? Having high blood glucose for a long time is dangerous. Too much glucose in your blood can damage your nerves and blood vessels. Long-term damage can lead to complications from diabetes, which may include:  Heart disease.  Stroke.  Blindness.  Kidney disease.  Depression.  Poor circulation in the feet and legs, which could lead to surgical removal (amputation) in severe cases.  How can prediabetes be prevented from turning into type 2 diabetes?  To help prevent type 2 diabetes, take the following actions:  Be physically active. ? Do moderate-intensity physical activity for at least 30 minutes on at least 5 days of the week, or as much as told by your health care provider. This could be brisk walking, biking, or water aerobics. ? Ask your health care provider what   activities are safe for you. A mix of physical activities may be best, such as walking, swimming, cycling, and strength training.  Lose weight as told by your health care provider. ? Losing 5-7% of your body weight can reverse insulin resistance. ? Your health  care provider can determine how much weight loss is best for you and can help you lose weight safely.  Follow a healthy meal plan. This includes eating lean proteins, complex carbohydrates, fresh fruits and vegetables, low-fat dairy products, and healthy fats. ? Follow instructions from your health care provider about eating or drinking restrictions. ? Make an appointment to see a diet and nutrition specialist (registered dietitian) to help you create a healthy eating plan that is right for you.  Do not smoke or use any tobacco products, such as cigarettes, chewing tobacco, and e-cigarettes. If you need help quitting, ask your health care provider.  Take over-the-counter and prescription medicines as told by your health care provider. You may be prescribed medicines that help lower the risk of type 2 diabetes.  This information is not intended to replace advice given to you by your health care provider. Make sure you discuss any questions you have with your health care provider. Document Released: 10/17/2015 Document Revised: 12/01/2015 Document Reviewed: 08/16/2015 Elsevier Interactive Patient Education  2018 Elsevier Inc.  

## 2017-07-23 NOTE — Progress Notes (Signed)
BP 102/70 (BP Location: Left Arm, Patient Position: Sitting, Cuff Size: Normal)   Pulse 80   Temp 98.2 F (36.8 C)   Ht 5\' 9"  (1.753 m)   Wt 165 lb (74.8 kg)   SpO2 96%   BMI 24.37 kg/m    Subjective:    Patient ID: Russell Whitehead, male    DOB: Nov 05, 1975, 42 y.o.   MRN: [id number]  HPI: Russell Whitehead is a 42 y.o. male presenting on 07/23/2017 for New Patient (Initial Visit) (pt states he was in a facility in [place] during the spring and summer. pt previous pt of [facility]. pt trying to get set up with [facility])   HPI   Pt is new resident at [facility] house where he is getting treatment for drug abuse.  Pt has already been for appointment at [facility] and he has follow up scheduled at that facility.   Pt has no complaints and says he is "healthy as a horse"  Pt had labs drawn while inpatient December 2018; results reviewed.   Relevant past medical, surgical, family and social history reviewed and updated as indicated. Interim medical history since our last visit reviewed. Allergies and medications reviewed and updated.   Current Outpatient Medications:  .  sertraline (ZOLOFT) 100 MG tablet, Take 1 tablet (100 mg total) by mouth daily., Disp: 30 tablet, Rfl: 1  Review of Systems  Constitutional: Negative for appetite change, chills, diaphoresis, fatigue, fever and unexpected weight change.  HENT: Positive for dental problem. Negative for congestion, drooling, ear pain, facial swelling, hearing loss, mouth sores, sneezing, sore throat, trouble swallowing and voice change.   Eyes: Negative for pain, discharge, redness, itching and visual disturbance.  Respiratory: Negative for cough, choking, shortness of breath and wheezing.   Cardiovascular: Negative for chest pain, palpitations and leg swelling.  Gastrointestinal: Negative for abdominal pain, blood in stool, constipation, diarrhea and vomiting.  Endocrine: Negative for cold intolerance, heat  intolerance and polydipsia.  Genitourinary: Negative for decreased urine volume, dysuria and hematuria.  Musculoskeletal: Negative for arthralgias, back pain and gait problem.  Skin: Negative for rash.  Allergic/Immunologic: Negative for environmental allergies.  Neurological: Negative for seizures, syncope, light-headedness and headaches.  Hematological: Negative for adenopathy.  Psychiatric/Behavioral: Positive for dysphoric mood. Negative for agitation and suicidal ideas. The patient is not nervous/anxious.     Per HPI unless specifically indicated above     Objective:    BP 102/70 (BP Location: Left Arm, Patient Position: Sitting, Cuff Size: Normal)   Pulse 80   Temp 98.2 F (36.8 C)   Ht 5\' 9"  (1.753 m)   Wt 165 lb (74.8 kg)   SpO2 96%   BMI 24.37 kg/m   Wt Readings from Last 3 Encounters:  07/23/17 165 lb (74.8 kg)  07/15/17 167 lb 12.8 oz (76.1 kg)  06/08/17 150 lb (68 kg)    Physical Exam  Constitutional: He is oriented to person, place, and time. He appears well-developed and well-nourished.  Pleasant and cooperative  HENT:  Head: Normocephalic and atraumatic.  Mouth/Throat: Oropharynx is clear and moist. No oropharyngeal exudate.  Eyes: Conjunctivae and EOM are normal. Pupils are equal, round, and reactive to light.  Neck: Neck supple. No thyromegaly present.  Cardiovascular: Normal rate and regular rhythm.  Pulmonary/Chest: Effort normal and breath sounds normal. He has no wheezes. He has no rales.  Abdominal: Soft. Bowel sounds are normal. He exhibits no mass. There is no hepatosplenomegaly. There is no tenderness.  Musculoskeletal: He exhibits no edema.  Lymphadenopathy:    He has no cervical adenopathy.  Neurological: He is alert and oriented to person, place, and time.  Skin: Skin is warm and dry. No rash noted.  Psychiatric: He has a normal mood and affect. His behavior is normal. Thought content normal.  Vitals reviewed.   Results for orders placed or  performed in visit on 07/15/17  POCT glucose  Result Value Ref Range   POC Glucose 128 (A) 70 - 99 mg/dl      Assessment & Plan:   Encounter Diagnoses  Name Primary?  . Polysubstance abuse (HCC) Yes  . Cigarette nicotine dependence without complication   . Prediabetes   . Depression, unspecified depression type      -discussed recent labs done in Hospital with pt -pt counseled on  Prediabetes and given handout -pt to continue with [facility] -pt to follow up one year.  RTO sooner prn

## 2017-08-01 ENCOUNTER — Telehealth: Payer: Self-pay

## 2017-08-01 NOTE — Telephone Encounter (Signed)
Pt was seen here at [facility] on 07/15/17 who is [facility] residing [facility] for substance abuse treatment. Pt was was called today 08/01/17 for follow-up from his visit with us. Pt states "everything is good" " everything went fine". Pt states he is doing good, [facility]. Advised pt to give us a call back if he had any questions, and to continue doing what his provider instructed him to do as well as continue [facility]. Pt agreed.     Jashanti Clinkscale R. [place], [place] [phone]

## 2018-07-23 ENCOUNTER — Ambulatory Visit: Payer: Self-pay | Admitting: Physician Assistant

## 2018-08-14 ENCOUNTER — Encounter: Payer: Self-pay | Admitting: Physician Assistant

## 2018-08-30 ENCOUNTER — Encounter (HOSPITAL_COMMUNITY): Payer: Self-pay

## 2018-08-30 ENCOUNTER — Other Ambulatory Visit: Payer: Self-pay

## 2018-08-30 ENCOUNTER — Emergency Department (HOSPITAL_COMMUNITY)
Admission: EM | Admit: 2018-08-30 | Discharge: 2018-08-30 | Disposition: A | Discharge status: Home / Self Care | Payer: Self-pay | Attending: Emergency Medicine | Admitting: Emergency Medicine

## 2018-08-30 DIAGNOSIS — F1410 Cocaine abuse, uncomplicated: Secondary | ICD-10-CM | POA: Insufficient documentation

## 2018-08-30 DIAGNOSIS — E876 Hypokalemia: Secondary | ICD-10-CM

## 2018-08-30 DIAGNOSIS — F10129 Alcohol abuse with intoxication, unspecified: Secondary | ICD-10-CM | POA: Insufficient documentation

## 2018-08-30 DIAGNOSIS — F17210 Nicotine dependence, cigarettes, uncomplicated: Secondary | ICD-10-CM | POA: Insufficient documentation

## 2018-08-30 DIAGNOSIS — Z79899 Other long term (current) drug therapy: Secondary | ICD-10-CM | POA: Insufficient documentation

## 2018-08-30 DIAGNOSIS — F10920 Alcohol use, unspecified with intoxication, uncomplicated: Secondary | ICD-10-CM

## 2018-08-30 DIAGNOSIS — F1910 Other psychoactive substance abuse, uncomplicated: Secondary | ICD-10-CM

## 2018-08-30 HISTORY — DX: Tuberculosis of lung: A15.0

## 2018-08-30 LAB — COMPREHENSIVE METABOLIC PANEL
ALT: 14 U/L (ref 0–44)
AST: 21 U/L (ref 15–41)
Albumin: 4.4 g/dL (ref 3.5–5.0)
Alkaline Phosphatase: 50 U/L (ref 38–126)
Anion gap: 11 (ref 5–15)
BUN: 12 mg/dL (ref 6–20)
CO2: 23 mmol/L (ref 22–32)
Calcium: 8.3 mg/dL — ABNORMAL LOW (ref 8.9–10.3)
Chloride: 103 mmol/L (ref 98–111)
Creatinine, Ser: 0.84 mg/dL (ref 0.61–1.24)
GFR calc Af Amer: >60 mL/min (ref >60)
Glucose, Bld: 110 mg/dL — ABNORMAL HIGH (ref 70–99)
POTASSIUM: 3.2 mmol/L — AB (ref 3.5–5.1)
Sodium: 137 mmol/L (ref 135–145)
Total Bilirubin: 0.7 mg/dL (ref 0.3–1.2)
Total Protein: 7.3 g/dL (ref 6.5–8.1)

## 2018-08-30 LAB — CBC
HCT: 44.8 % (ref 39.0–52.0)
Hemoglobin: 14.2 g/dL (ref 13.0–17.0)
MCH: 29.6 pg (ref 26.0–34.0)
MCHC: 31.7 g/dL (ref 30.0–36.0)
MCV: 93.3 fL (ref 80.0–100.0)
Platelets: 198 10*3/uL (ref 150–400)
RBC: 4.8 MIL/uL (ref 4.22–5.81)
RDW: 13.4 % (ref 11.5–15.5)
WBC: 12.1 10*3/uL — AB (ref 4.0–10.5)
nRBC: 0 % (ref 0.0–0.2)

## 2018-08-30 LAB — ETHANOL: ALCOHOL ETHYL (B): 138 mg/dL — AB (ref <10)

## 2018-08-30 MED ORDER — ACETAMINOPHEN 325 MG PO TABS: 650.0000 mg | ORAL_TABLET | ORAL | Status: DC | PRN

## 2018-08-30 MED ORDER — POTASSIUM CHLORIDE CRYS ER 20 MEQ PO TBCR
40.0000 meq | EXTENDED_RELEASE_TABLET | Freq: Once | ORAL | Status: AC
  Administered 2018-08-30: 40 meq via ORAL
  Filled 2018-08-30: qty 2

## 2018-08-30 MED ORDER — ALUM & MAG HYDROXIDE-SIMETH 200-200-20 MG/5ML PO SUSP: 30.0000 mL | Freq: Four times a day (QID) | ORAL | Status: DC | PRN

## 2018-08-30 MED ORDER — NICOTINE 7 MG/24HR TD PT24
7.0000 mg | MEDICATED_PATCH | Freq: Every day | TRANSDERMAL | Status: DC
  Administered 2018-08-30: 7 mg via TRANSDERMAL
  Filled 2018-08-30 (×3): qty 1

## 2018-08-30 MED ORDER — ONDANSETRON HCL 4 MG PO TABS: 4.0000 mg | ORAL_TABLET | Freq: Three times a day (TID) | ORAL | Status: DC | PRN

## 2018-08-30 MED ORDER — SERTRALINE HCL 50 MG PO TABS
100.0000 mg | ORAL_TABLET | Freq: Every day | ORAL | Status: DC
  Administered 2018-08-30: 100 mg via ORAL
  Filled 2018-08-30: qty 2
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
## 2018-08-30 NOTE — ED Provider Notes (Signed)
[facility] EMERGENCY DEPARTMENT Provider Note   CSN: [id number] Arrival date & time: 08/30/18  0231    History   Chief Complaint Chief Complaint  Patient presents with  . Addiction Problem    HPI Russell Whitehead is a 43 y.o. male.   The history is provided by the patient.  He has history of depression and substance abuse and comes in requesting assistance with alcohol and cocaine abuse.  He states that he abuses alcohol, cocaine, marijuana with last use of all of these within the last 2hours.  When asked if he is depressed, he states "I have a little bit of everything".  He denies suicidal ideation or homicidal ideation.  He denies hallucinations.  Past Medical History:  Diagnosis Date  . Depression   . Substance abuse (HCC)   . TB (pulmonary tuberculosis)    from hospitalization/admission at [facility] in 2018    Patient Active Problem List   Diagnosis Date Noted  . Prediabetes 07/23/2017  . Cocaine use disorder (HCC) 06/09/2017  . Cannabis use disorder, moderate, dependence (HCC) 06/09/2017  . Cocaine use disorder, severe, dependence (HCC) 09/16/2016  . Tobacco use disorder 09/16/2016  . 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 HERNIA REPAIR  2016        Home Medications    Prior to Admission medications   Medication Sig Start Date End Date Taking? Authorizing Provider  sertraline (ZOLOFT) 100 MG tablet Take 1 tablet (100 mg total) by mouth daily. 06/13/17   Shari Prows, MD    Family History Family History  Problem Relation Age of Onset  . Diabetes Mother   . Heart disease Mother   . Heart failure Mother   . Depression Father   . Heart attack Father   . Heart disease Father   . Cancer Maternal Grandmother        brain tumor  . Stroke Paternal Grandmother     Social History Social  History   Tobacco Use  . Smoking status: Current Every Day Smoker    Packs/day: 0.25    Years: 24.00    Pack years: 6.00    Types: Cigarettes  . Smokeless tobacco: Never Used  Substance Use Topics  . Alcohol use: Yes    Comment: 6-10 beers daily   . Drug use: Yes    Types: Cocaine, "Crack" cocaine, Marijuana    Comment: last MJ & cocaine and crack 08/30/2018     Allergies   Patient has no known allergies.   Review of Systems Review of Systems  All other systems reviewed and are negative.    Physical Exam Updated Vital Signs BP 115/82   Pulse 98   Temp 98.1 F (36.7 C) (Oral)   Resp 17   Ht 5\' 10"  (1.778 m)   Wt 72.6 kg   SpO2 100%   BMI 22.96 kg/m   Physical Exam Vitals signs and nursing note reviewed.    43 year old male, resting comfortably and in no acute distress. Vital signs are normal. Oxygen saturation is 100%, which is normal. Head is normocephalic and atraumatic. PERRLA, EOMI. Oropharynx is clear. Neck is nontender and supple without adenopathy or JVD. Back is nontender and there is no CVA tenderness. Lungs are clear without rales, wheezes, or rhonchi. Chest is nontender. Heart has regular rate and rhythm without murmur. Abdomen is soft,  flat, nontender without masses or hepatosplenomegaly and peristalsis is normoactive. Extremities have no cyanosis or edema, full range of motion is present. Skin is warm and dry without rash. Neurologic: Mental status is normal, cranial nerves are intact, there are no motor or sensory deficits.  ED Treatments / Results  Labs (all labs ordered are listed, but only abnormal results are displayed) Labs Reviewed  COMPREHENSIVE METABOLIC PANEL - Abnormal; Notable for the following components:      Result Value   Potassium 3.2 (*)    Glucose, Bld 110 (*)    Calcium 8.3 (*)    All other components within normal limits  ETHANOL - Abnormal; Notable for the following components:   Alcohol, Ethyl (B) 138 (*)    All  other components within normal limits  CBC - Abnormal; Notable for the following components:   WBC 12.1 (*)    All other components within normal limits  RAPID URINE DRUG SCREEN, HOSP PERFORMED    EKG EKG Interpretation  Date/Time:  Saturday August 30 2018 02:49:48 EST Ventricular Rate:  87 PR Interval:  172 QRS Duration: 88 QT Interval:  364 QTC Calculation: 438 R Axis:   73 Text Interpretation:  Normal sinus rhythm Left ventricular hypertrophy Abnormal ECG When compared with ECG of 06/09/2017, No significant change was found Confirmed by Dione Booze ([id number]) on 08/30/2018 3:25:12 AM  Procedures Procedures   Medications Ordered in ED Medications  nicotine (NICODERM CQ - dosed in mg/24 hr) patch 7 mg (has no administration in time range)  alum & mag hydroxide-simeth (MAALOX/MYLANTA) 200-200-20 MG/5ML suspension 30 mL (has no administration in time range)  ondansetron (ZOFRAN) tablet 4 mg (has no administration in time range)  acetaminophen (TYLENOL) tablet 650 mg (has no administration in time range)  sertraline (ZOLOFT) tablet 100 mg (has no administration in time range)  potassium chloride SA (K-DUR,KLOR-CON) CR tablet 40 mEq (has no administration in time range)     Initial Impression / Assessment and Plan / ED Course  I have reviewed the triage vital signs and the nursing notes.  Pertinent lab results that were available during my care of the patient were reviewed by me and considered in my medical decision making (see chart for details).  Polysubstance abuse.  Old records are reviewed, and he has had several mental health hospitalizations and history of suicidal ideation in the past.  Will check screening labs and obtain consultation with TTS.  Also, please note that in his problem list, it states pulmonary tuberculosis diagnosed during hospitalization at [facility] in 2018.  I reviewed that record and chest x-ray obtained at that time and there is no evidence of pulmonary  tuberculosis in the record whatsoever.  Labs show mild hypokalemia and is given a dose of oral potassium.  Alcohol level is mildly elevated.  TTS consultation is appreciated.  Patient does not meet inpatient criteria, but will be held in the ED for psychiatric evaluation in the morning.  Final Clinical Impressions(s) / ED Diagnoses   Final diagnoses:  Polysubstance abuse (HCC)  Hypokalemia  Alcohol intoxication, uncomplicated [facility])    ED Discharge Orders    None       Dione Booze, MD 08/30/18 [phone]

## 2018-08-30 NOTE — ED Notes (Signed)
Pt is upset he is being discharged.  States he needs help with drugs and alcohol and nobody wants to help him.  Denies being suicidal or homicidal at this time and states this is not the problem.  Given resource list and discussed follow up options extensively, but pt was not interested.  States "Russell Whitehead is a joke and I'm not going to do anything ya'll have told me to."  Refused to sign d/c instructions.

## 2018-08-30 NOTE — ED Notes (Signed)
Patient asleep at this time.

## 2018-08-30 NOTE — ED Provider Notes (Signed)
Psych team has re-evaluated pt this morning: denies SI/HI/AVH, no indication for psychiatric admission at this time and can be discharged, SW will fax over outpatient substance abuse treatment programs information. Will d/c stable.    Samuel Jester, DO 08/30/18 1031

## 2018-08-30 NOTE — ED Triage Notes (Signed)
Pt reports "wanting help with his addiction" and "mental problems". Pt says he took a little powder cocaine, a little crack, liquor, and marijuana tonight.

## 2018-08-30 NOTE — Discharge Instructions (Addendum)
Substance Abuse Treatment Programs ° °Intensive Outpatient Programs °[facility]     °[address]      °[place], [place]                   °[phone]      ° °[facility] °[address] °[place], [place] °[phone] ° °[facility]     °(Inpatient and outpatient)     °[address]           °[phone]   ° °[facility] °[phone] (Suboxone and Methadone) ° °[address]      °[place], [place] [address]      °[phone]      ° °[address] °[place], [place] °852-3033 ° °[facility] (Outpatient/Inpatient, Chemical)    °(insurance only) [phone]      °       °[facility] (Groups & Residential) °[place], [place] °[phone] ° °   °[facility]     °[address]     °[place], [place]      °[phone]      ° °[facility] (for caregivers and family) °[address] °[place], [place] °[phone] ° ° ° ° ° °Residential Treatment Programs °[facility]      °[address], [place], [place] [address]  °[phone]      ° °[facility] °[address] [place], [place] [address] °[phone] ° °[facility]        °[phone]      ° °[facility] °[phone] ° °[facility]             °[address]                                         °[place], [place]                                                °[phone] or [phone]                              ° °[facility] [place] °[address] °[place], [address] °[phone] ° °[facility]    °[address]      °, [place]     °[phone]      ° °[facility] °[address] °, [place] °[phone] ° °[facility]   °[address]     °[place], [place] [address]     °[phone]      °Admissions: 8am-3pm M-F ° °[facility] °[address] °[place],  [place] °[phone] ° °[facility]: Residential Program (90 Days)   °[place], [place]      °[phone] or [phone]    ° °[facility] °[place], [place] °(Walk in Hours over the weekend or by referral) ° °[facility] °[address], [place], [place] [address] °([phone] ° °Crisis Mobile: [facility]:  1-877-626-1772 (for crisis response 24 hours a day) °[facility] Hotline:      1-800-256-2452 °Outpatient Psychiatry and Counseling ° °[facility]: Mobile Crisis   Management 24 hours:  1-579-867-5796  [facility] of the [employer] sliding scale fee and walk in schedule: M-F 8am-12pm/1pm-3pm [address]  [place], [place] [address] [place], [place] [address] [phone]  [facility] ([facility])- new patient walk-in appointments available Monday - Friday 8am -3pm.          [address] [place], [place] [address] [phone] or crisis line- [facility]/ Intensive Outpatient Therapy Program [place], [place] [address] [place]      [phone] [place], [place] [address]                 [place]   [facility] [phone] [place], [place] [address]   [facility]          [address] Johnette Abraham  [place], [place] [address]       [phone]  [facility] [address] [place], [place] [address] [phone]  [facility]    [address] [place], [place] [address]     [place], [place] Joycelyn Man     [place] [address]     [phone]       [facility] [place] [address]  [facility]     [address], [place], [place], [place] [address] [place]     [address]     [place], [place] [address]     [phone]       [facility] [address] [place], [place] [address] [phone] Resources for Temporary Residential Assistance/Crisis [facility]) M-F 8am-3pm   [address] [place], [place] [address]   [phone] Services include: laundry, barbering, support groups, case management, phone  & computer access, showers, AA/NA mtgs, mental health/substance abuse nurse, job skills class, disability information, VA assistance, spiritual classes, etc.   [facility]   [address], [place]     [place]              [employer] (women and children)       [place]. [place], [place] [address] [phone] [email or web address] for application and process Application Required  [facility]   [address]    [place] [address]     [phone]                    [place],  [address] [phone](OILNZVJK application appt.) Application Required  [facility] (women only)    [address]     [place],  [address]     [phone]  Intake starts 6pm daily Need valid ID, SSC, & Police report [employer] [address] [place], [place] [phone] Application Required  [facility] (men only)     [address]      [place], [place]     [phone]       [facility] (Pregnant women only) [address] [place], [place] [phone]  [facility]      [address]      [place], [place] [address]     [phone]             [facility] [address] [place], [place] [phone] 90 day commitment/SA/Application process  [facility](men only)     [address]     [place], [place]     [phone]       Check-in at [facility] of [facility] [address] [place], [place] [address] [phone] Men/Women/Women and Children must be there by 7 pm  [facility] [place], [place] [phone]                  Take your usual prescriptions as previously directed.  Call your regular medical doctor on Monday to schedule a follow up appointment within the next week. Call the resource given to you today if you are interested in detox programs.  Return to the Emergency Department immediately sooner if worsening.

## 2018-08-30 NOTE — Progress Notes (Signed)
CSW faxed over resources for substance use services within the community.  Vilma Meckel. Algis Greenhouse, MSW, LCSW Clinical Social Work/Disposition Phone: [phone] Fax: [phone]

## 2018-09-24 ENCOUNTER — Other Ambulatory Visit: Payer: Self-pay

## 2018-09-24 ENCOUNTER — Emergency Department (HOSPITAL_COMMUNITY)
Admission: EM | Admit: 2018-09-24 | Discharge: 2018-09-24 | Disposition: A | Discharge status: Home / Self Care | Payer: Self-pay | Attending: Emergency Medicine | Admitting: Emergency Medicine

## 2018-09-24 ENCOUNTER — Encounter (HOSPITAL_COMMUNITY): Payer: Self-pay | Admitting: Emergency Medicine

## 2018-09-24 DIAGNOSIS — R1030 Lower abdominal pain, unspecified: Secondary | ICD-10-CM | POA: Insufficient documentation

## 2018-09-24 DIAGNOSIS — F17210 Nicotine dependence, cigarettes, uncomplicated: Secondary | ICD-10-CM | POA: Insufficient documentation

## 2018-09-24 LAB — CBC WITH DIFFERENTIAL/PLATELET
Abs Immature Granulocytes: 0.04 10*3/uL (ref 0.00–0.07)
Basophils Absolute: 0.1 10*3/uL (ref 0.0–0.1)
Basophils Relative: 1 %
Eosinophils Absolute: 0.1 10*3/uL (ref 0.0–0.5)
Eosinophils Relative: 1 %
HCT: 47.3 % (ref 39.0–52.0)
Hemoglobin: 15.3 g/dL (ref 13.0–17.0)
Immature Granulocytes: 0 %
Lymphocytes Relative: 15 %
Lymphs Abs: 1.5 10*3/uL (ref 0.7–4.0)
MCH: 30.3 pg (ref 26.0–34.0)
MCHC: 32.3 g/dL (ref 30.0–36.0)
MCV: 93.7 fL (ref 80.0–100.0)
Monocytes Absolute: 0.8 10*3/uL (ref 0.1–1.0)
Monocytes Relative: 8 %
Neutro Abs: 7.4 10*3/uL (ref 1.7–7.7)
Neutrophils Relative %: 75 %
PLATELETS: 186 10*3/uL (ref 150–400)
RBC: 5.05 MIL/uL (ref 4.22–5.81)
RDW: 13.5 % (ref 11.5–15.5)
WBC: 9.8 10*3/uL (ref 4.0–10.5)
nRBC: 0 % (ref 0.0–0.2)

## 2018-09-24 LAB — COMPREHENSIVE METABOLIC PANEL
ALT: 16 U/L (ref 0–44)
AST: 27 U/L (ref 15–41)
Albumin: 4.6 g/dL (ref 3.5–5.0)
Alkaline Phosphatase: 46 U/L (ref 38–126)
Anion gap: 10 (ref 5–15)
BUN: 13 mg/dL (ref 6–20)
CHLORIDE: 106 mmol/L (ref 98–111)
CO2: 24 mmol/L (ref 22–32)
CREATININE: 0.96 mg/dL (ref 0.61–1.24)
Calcium: 8.9 mg/dL (ref 8.9–10.3)
GFR calc Af Amer: >60 mL/min (ref >60)
Glucose, Bld: 87 mg/dL (ref 70–99)
Potassium: 4.2 mmol/L (ref 3.5–5.1)
Sodium: 140 mmol/L (ref 135–145)
Total Bilirubin: 0.6 mg/dL (ref 0.3–1.2)
Total Protein: 7.9 g/dL (ref 6.5–8.1)

## 2018-09-24 LAB — LIPASE, BLOOD: Lipase: 19 U/L (ref 11–51)

## 2018-09-24 NOTE — ED Notes (Signed)
Pt works at [employer], [place] he only had the one episode, and they told him he needed to come here for evaluation before he could return to work.

## 2018-09-24 NOTE — ED Provider Notes (Signed)
[facility] EMERGENCY DEPARTMENT Provider Note   CSN: [id number] Arrival date & time: 09/24/18  1602    History   Chief Complaint Chief Complaint  Patient presents with  . Abdominal Pain    HPI Sahibjot Siemens is a 43 y.o. male with a past medical history of depression, substance abuse, and surgical history of left inguinal hernia repair presenting with a 1 day history of weakness of infraumbilical pain which has resolved since having 2 small bowel movements, not diarrheal or bloody, but "thinner" than normal.  He at this time simply continues to feel fatigued and feels hungry. Also reports he woke with a sore throat today but this has resolved.  He had 6 beers last night which is not atypical for him.  He denies fevers, chills, n/v or abdominal pain at this time.  He requires a work note to return to work tomorrow, stating this is the main reason for his visit today.     The history is provided by the patient.    Past Medical History:  Diagnosis Date  . Depression   . Substance abuse (HCC)   . TB (pulmonary tuberculosis)    from hospitalization/admission at [facility] in 2018    Patient Active Problem List   Diagnosis Date Noted  . Prediabetes 07/23/2017  . Cocaine use disorder (HCC) 06/09/2017  . Cannabis use disorder, moderate, dependence (HCC) 06/09/2017  . Cocaine use disorder, severe, dependence (HCC) 09/16/2016  . Tobacco use disorder 09/16/2016  . 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 HERNIA REPAIR  2016        Home Medications    Prior to Admission medications   Medication Sig Start Date End Date Taking? Authorizing Provider  sertraline (ZOLOFT) 100 MG tablet Take 1 tablet (100 mg total) by mouth daily. Patient not taking: Reported on 09/24/2018 06/13/17   Shari Prows, MD    Family  History Family History  Problem Relation Age of Onset  . Diabetes Mother   . Heart disease Mother   . Heart failure Mother   . Depression Father   . Heart attack Father   . Heart disease Father   . Cancer Maternal Grandmother        brain tumor  . Stroke Paternal Grandmother     Social History Social History   Tobacco Use  . Smoking status: Current Every Day Smoker    Packs/day: 0.25    Years: 24.00    Pack years: 6.00    Types: Cigarettes  . Smokeless tobacco: Never Used  Substance Use Topics  . Alcohol use: Yes    Comment: 3-4  beers daily   . Drug use: Yes    Types: Cocaine, "Crack" cocaine, Marijuana    Comment: last MJ & cocaine and crack 08/30/2018     Allergies   Patient has no known allergies.   Review of Systems Review of Systems  Constitutional: Positive for fatigue. Negative for chills and fever.  HENT: Positive for sore throat. Negative for congestion.   Eyes: Negative.   Respiratory: Negative for chest tightness and shortness of breath.   Cardiovascular: Negative for chest pain.  Gastrointestinal: Positive for abdominal pain. Negative for diarrhea, nausea and vomiting.  Genitourinary: Negative.   Musculoskeletal: Negative for arthralgias, joint swelling and neck pain.  Skin: Negative.  Negative for rash and wound.  Neurological: Negative  for dizziness, weakness, light-headedness, numbness and headaches.  Psychiatric/Behavioral: Negative.      Physical Exam Updated Vital Signs BP 128/81   Pulse 65   Temp 97.7 F (36.5 C) (Oral)   Ht 5\' 10"  (1.778 m)   Wt 72.6 kg   SpO2 100%   BMI 22.96 kg/m   Physical Exam Vitals signs and nursing note reviewed.  Constitutional:      Appearance: He is well-developed.  HENT:     Head: Normocephalic and atraumatic.  Eyes:     Conjunctiva/sclera: Conjunctivae normal.  Neck:     Musculoskeletal: Normal range of motion.  Cardiovascular:     Rate and Rhythm: Normal rate and regular rhythm.     Heart  sounds: Normal heart sounds.  Pulmonary:     Effort: Pulmonary effort is normal. No respiratory distress.     Breath sounds: Normal breath sounds. No wheezing.  Abdominal:     General: Bowel sounds are normal.     Palpations: Abdomen is soft.     Tenderness: There is no abdominal tenderness. There is no guarding or rebound.  Musculoskeletal: Normal range of motion.  Skin:    General: Skin is warm and dry.  Neurological:     Mental Status: He is alert.      ED Treatments / Results  Labs (all labs ordered are listed, but only abnormal results are displayed) Labs Reviewed  CBC WITH DIFFERENTIAL/PLATELET  COMPREHENSIVE METABOLIC PANEL  LIPASE, BLOOD    EKG None  Radiology No results found.  Procedures Procedures (including critical care time)  Medications Ordered in ED Medications - No data to display   Initial Impression / Assessment and Plan / ED Course  I have reviewed the triage vital signs and the nursing notes.  Pertinent labs & imaging results that were available during my care of the patient were reviewed by me and considered in my medical decision making (see chart for details).        Labs reviewed and normal.  Pt exam normal, vss. Prn f/u anticipated.  Final Clinical Impressions(s) / ED Diagnoses   Final diagnoses:  Lower abdominal pain    ED Discharge Orders    None       Victoriano Lain 09/24/18 1741    Linwood Dibbles, MD 09/24/18 2211

## 2018-09-24 NOTE — Discharge Instructions (Addendum)
Your lab tests are normal today.  As long as you are feeling better, you may return to work tomorrow.  Get rechecked for any return of or worsening symptoms.

## 2018-09-24 NOTE — ED Triage Notes (Signed)
Up on waking abdominal pain and diarrhea, feeling better at the present.  Hx of hernia.

## 2019-10-30 IMAGING — CR DG CHEST 2V
2 series · 2 of 2 positions shown · non-contrast
Comparison: None.

CLINICAL DATA: Tuberculosis

EXAM:
CHEST  2 VIEW

[chest pa]
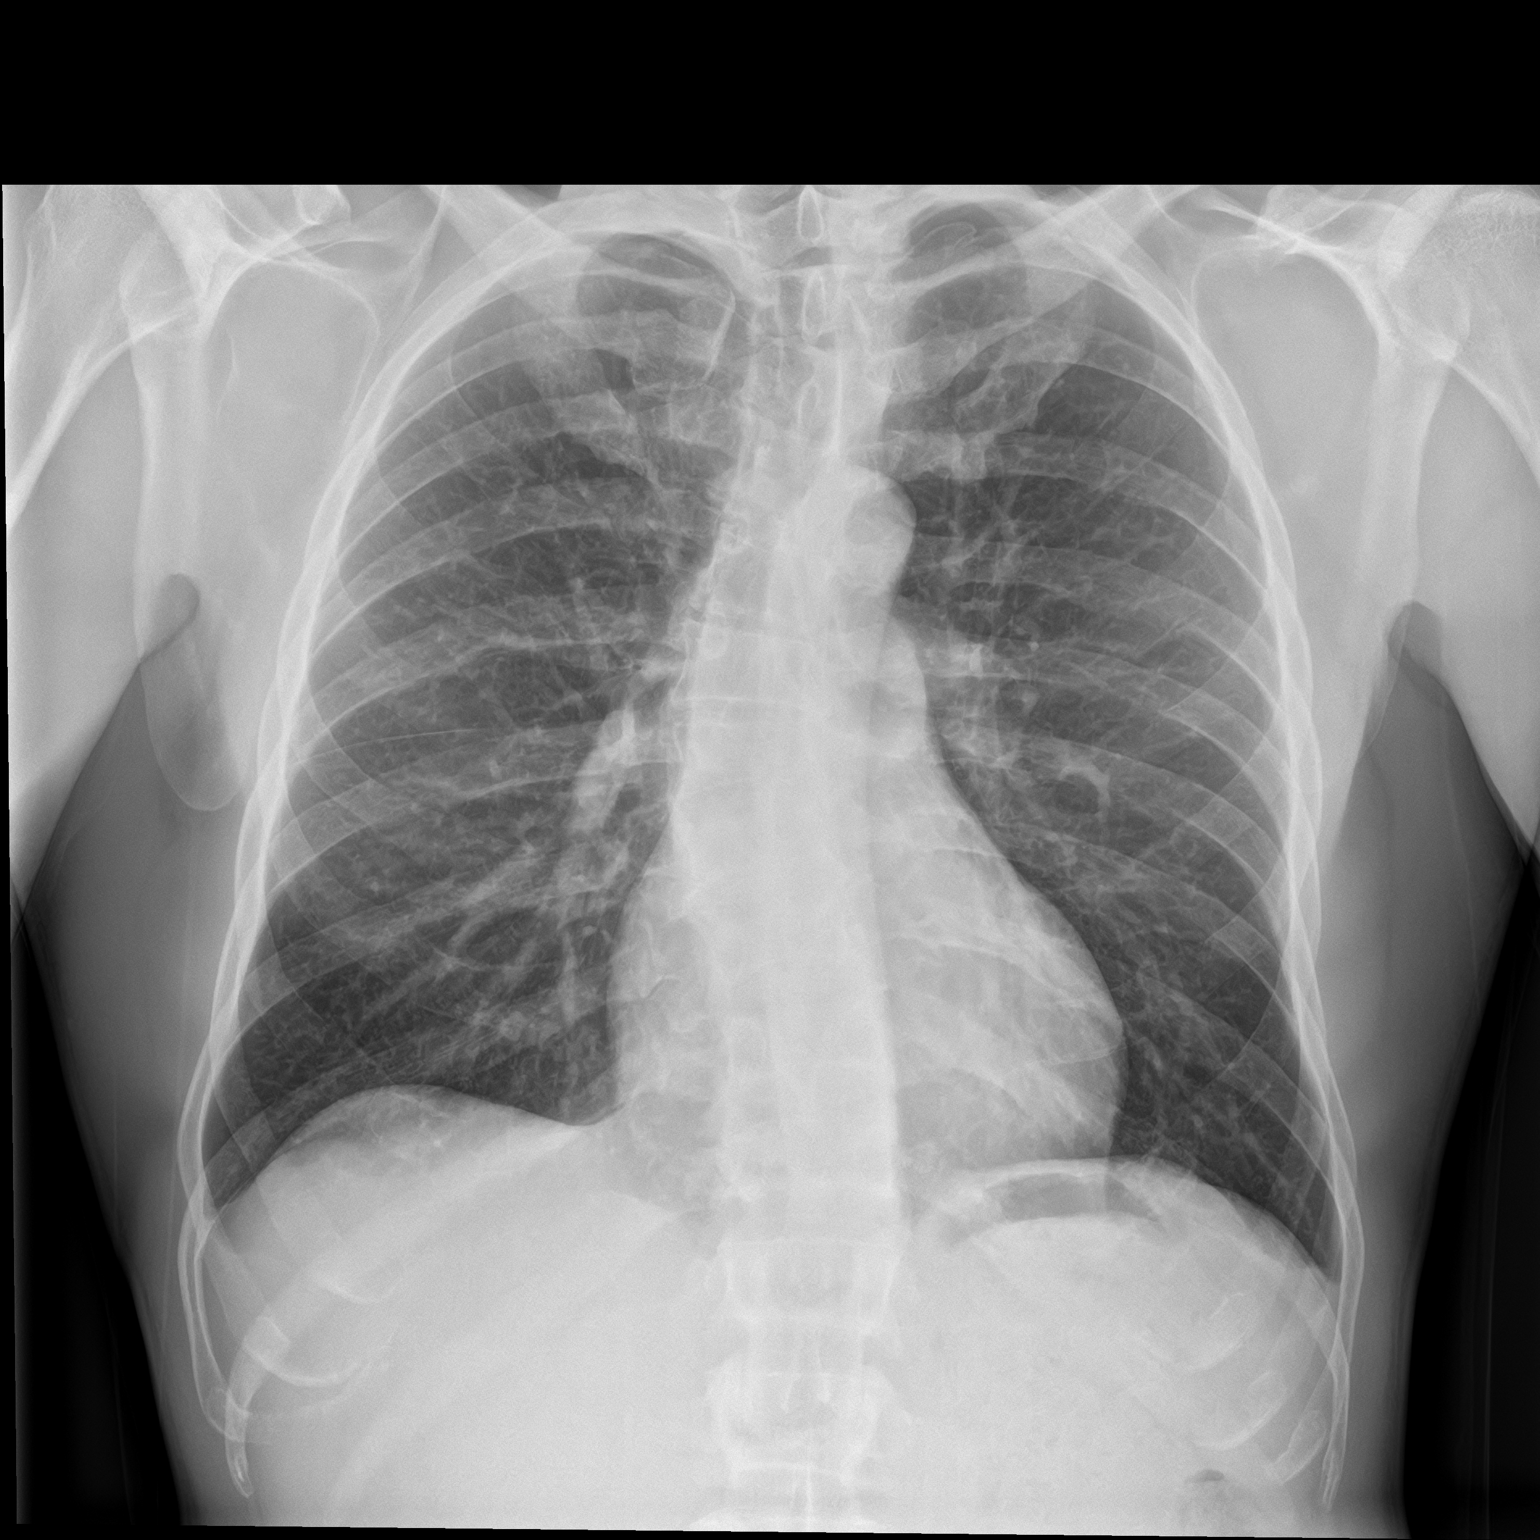

[chest lat]
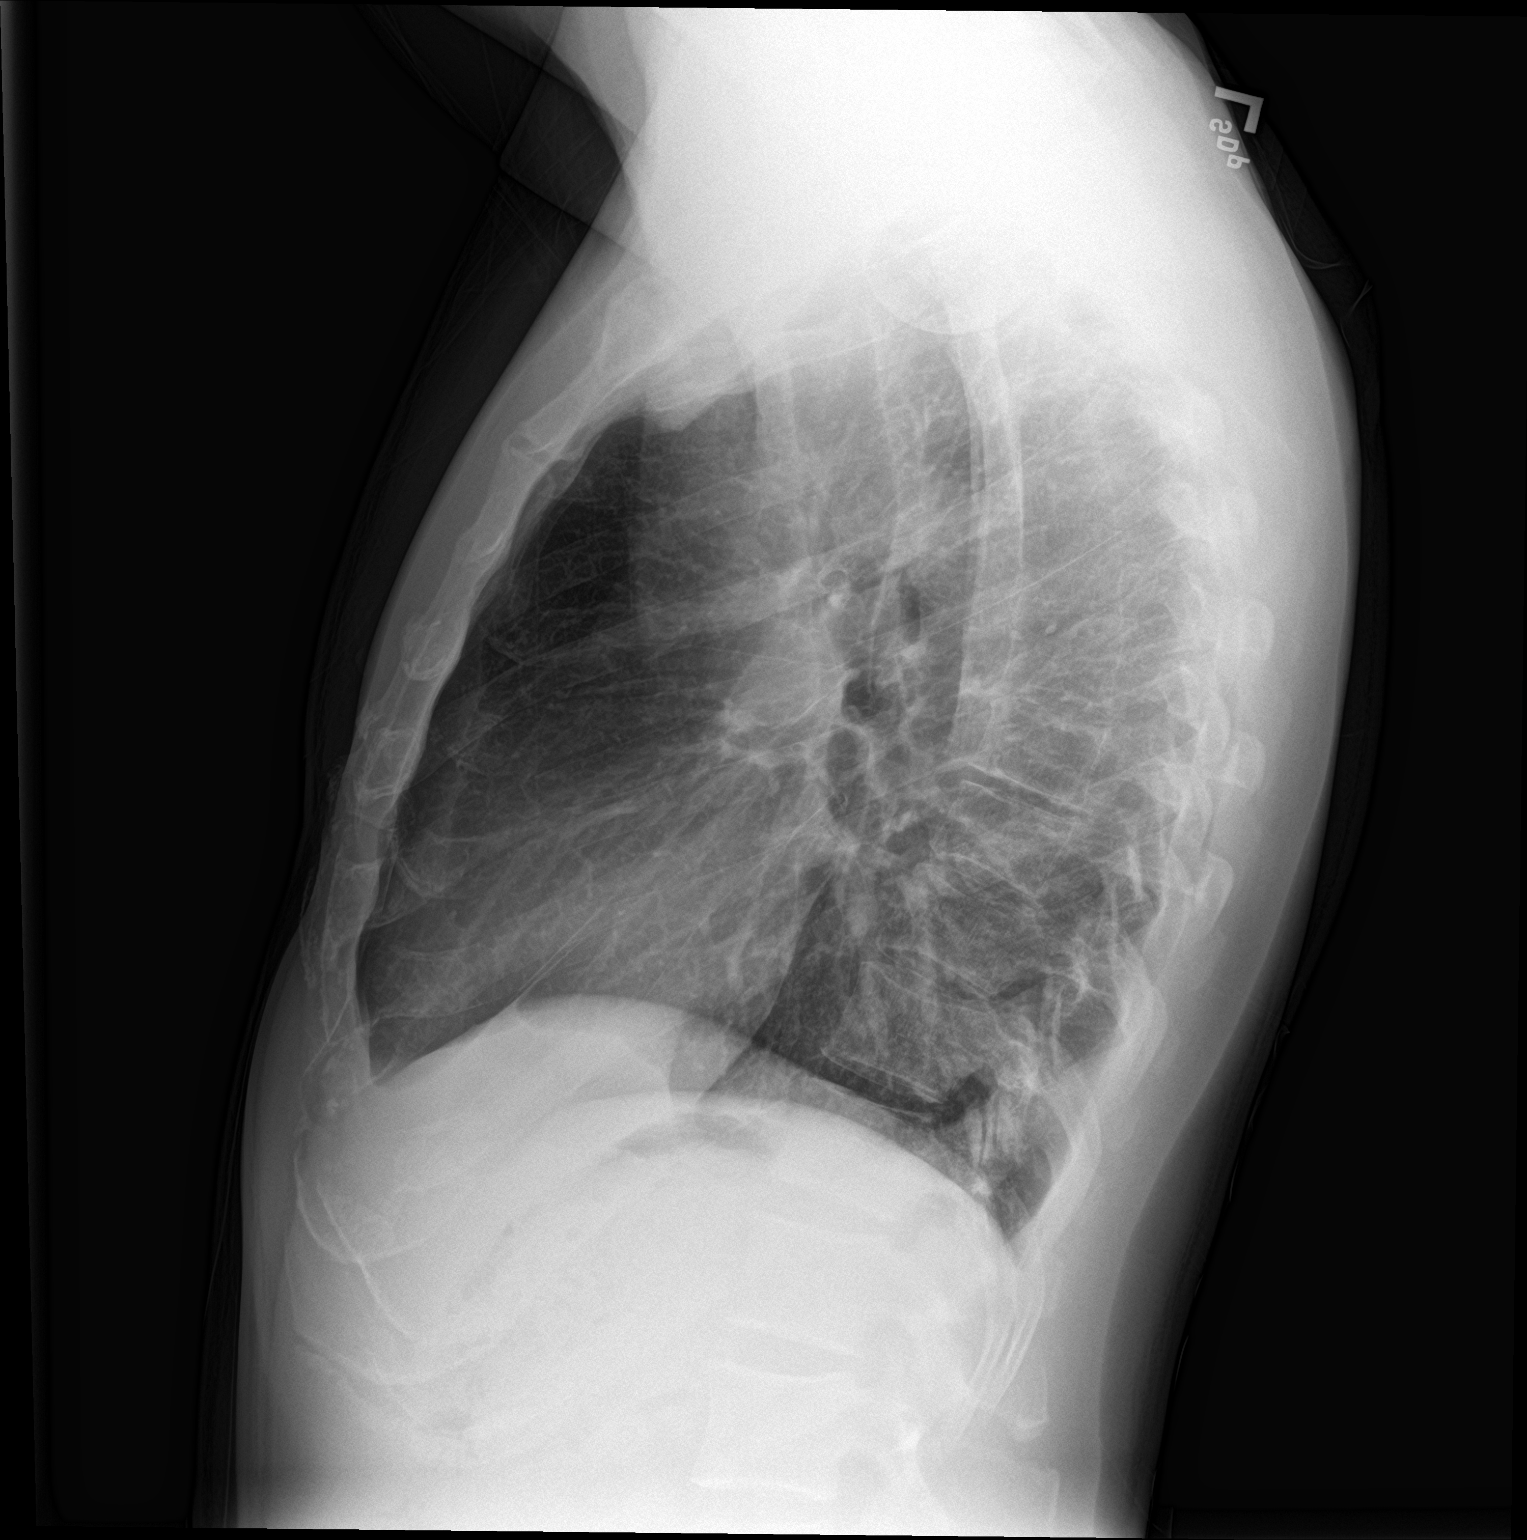

[2 of 2 positions shown; findings below may reference images not displayed]

FINDINGS: The heart size and mediastinal contours are within normal limits.
Both lungs are clear. No pulmonary scarring or adenopathy. Mild
dextroconvex curvature of the midthoracic spine.
IMPRESSION: No active cardiopulmonary disease.  No evidence of tuberculosis.

## 2023-12-13 ENCOUNTER — Ambulatory Visit: Payer: Self-pay | Admitting: Family Medicine

## 2023-12-20 ENCOUNTER — Ambulatory Visit: Payer: Self-pay | Admitting: Family Medicine

## 2023-12-20 ENCOUNTER — Encounter: Payer: Self-pay | Admitting: Family Medicine

## 2023-12-20 VITALS — BP 119/81 | HR 54 | Ht 70.0 in | Wt 163.0 lb

## 2023-12-20 DIAGNOSIS — Z1211 Encounter for screening for malignant neoplasm of colon: Secondary | ICD-10-CM

## 2023-12-20 DIAGNOSIS — Z125 Encounter for screening for malignant neoplasm of prostate: Secondary | ICD-10-CM

## 2023-12-20 DIAGNOSIS — Z0000 Encounter for general adult medical examination without abnormal findings: Secondary | ICD-10-CM

## 2023-12-20 DIAGNOSIS — Z23 Encounter for immunization: Secondary | ICD-10-CM

## 2023-12-20 NOTE — Progress Notes (Signed)
 Office Note 12/20/2023  CC:  Chief Complaint  Patient presents with   Establish Care    New pt est care.   HPI:  Russell Whitehead is a 48 y.o. male who is here to establish care,CPE. Patient's most recent primary MD: None Old records in [facility] EMR were reviewed prior to or during today's visit (most recent 2020).  Feeling well, no acute concerns. He does have a history of some stress headaches.  Pulmonary tuberculosis is listed in his past history section but in review of his records I can see no evidence of this diagnosis ever being made.  Also, Russell Whitehead does not recall ever having a pulmonary illness and does not recall being told anything about having had tuberculosis.  Past Medical History:  Diagnosis Date   Depression    pt states only d/t subst abuse in the past   Frequent headaches    History of substance abuse (HCC)    clean since 2020    Past Surgical History:  Procedure Laterality Date   HERNIA REPAIR  2016    Family History  Problem Relation Age of Onset   Diabetes Mother    Heart disease Mother    Heart failure Mother    Depression Father    Heart attack Father    Heart disease Father    Cancer Maternal Grandmother        brain tumor   Stroke Paternal Grandmother     Social History   Socioeconomic History   Marital status: Married    Spouse name: Not on file   Number of children: Not on file   Years of education: Not on file   Highest education level: Not on file  Occupational History   Not on file  Tobacco Use   Smoking status: Every Day    Current packs/day: 0.25    Average packs/day: 0.3 packs/day for 24.0 years (6.0 ttl pk-yrs)    Types: Cigarettes   Smokeless tobacco: Never  Vaping Use   Vaping status: Never Used  Substance and Sexual Activity   Alcohol use: Yes    Comment: 3-4  beers daily    Drug use: Yes    Types: Cocaine, Crack cocaine, Marijuana    Comment: last MJ & cocaine and crack 08/30/2018   Sexual  activity: Yes    Partners: Female  Other Topics Concern   Not on file  Social History Narrative   Married, 1 daughter.   Originally from [place] .   Education: High school.   Occupation: Scientist, water quality   History of substance abuse (mostly cocaine).  Clean since 2020.   Incarcerated 2022 2025.   [employer] [phone]   +smoker.   No alc   Social Drivers of Corporate investment banker Strain: Not on file  Food Insecurity: Not on file  Transportation Needs: Not on file  Physical Activity: Not on file  Stress: Not on file  Social Connections: Not on file  Intimate Partner Violence: Not on file    Outpatient Encounter Medications as of 12/20/2023  Medication Sig   [DISCONTINUED] sertraline  (ZOLOFT ) 100 MG tablet Take 1 tablet (100 mg total) by mouth daily. (Patient not taking: Reported on 12/20/2023)   No facility-administered encounter medications on file as of 12/20/2023.    No Known Allergies  Review of Systems  Constitutional:  Negative for appetite change, chills, fatigue and fever.  HENT:  Negative for congestion, dental problem, ear pain and sore throat.   Eyes:  Negative for discharge, redness and visual disturbance.  Respiratory:  Negative for cough, chest tightness, shortness of breath and wheezing.   Cardiovascular:  Negative for chest pain, palpitations and leg swelling.  Gastrointestinal:  Negative for abdominal pain, blood in stool, diarrhea, nausea and vomiting.  Genitourinary:  Negative for difficulty urinating, dysuria, flank pain, frequency, hematuria and urgency.  Musculoskeletal:  Negative for arthralgias, back pain, joint swelling, myalgias and neck stiffness.  Skin:  Negative for pallor and rash.  Neurological:  Negative for dizziness, speech difficulty, weakness and headaches.  Hematological:  Negative for adenopathy. Does not bruise/bleed easily.  Psychiatric/Behavioral:  Negative for confusion and sleep disturbance. The patient is not  nervous/anxious.     PE; Blood pressure 119/81, pulse (!) 54, height 5' 10 (1.778 m), weight 163 lb (73.9 kg), SpO2 100%. Body mass index is 23.39 kg/m.  Physical Exam  Gen: Alert, well appearing.  Patient is oriented to person, place, time, and situation. AFFECT: pleasant, lucid thought and speech. ENT: Ears: EACs clear, normal epithelium.  TMs with good light reflex and landmarks bilaterally.  Eyes: no injection, icteris, swelling, or exudate.  EOMI, PERRLA. Nose: no drainage or turbinate edema/swelling.  No injection or focal lesion.  Mouth: lips without lesion/swelling.  Oral mucosa pink and moist.  Dentition intact and without obvious caries or gingival swelling.  Oropharynx without erythema, exudate, or swelling.  Neck: supple/nontender.  No LAD, mass, or TM.  Carotid pulses 2+ bilaterally, without bruits. CV: RRR, no m/r/g.   LUNGS: CTA bilat, nonlabored resps, good aeration in all lung fields. ABD: soft, NT, ND, BS normal.  No hepatospenomegaly or mass.  No bruits. EXT: no clubbing, cyanosis, or edema.  Musculoskeletal: no joint swelling, erythema, warmth, or tenderness.  ROM of all joints intact. Skin - no sores or suspicious lesions or rashes or color changes  Pertinent labs:  Last CBC Lab Results  Component Value Date   WBC 9.8 09/24/2018   HGB 15.3 09/24/2018   HCT 47.3 09/24/2018   MCV 93.7 09/24/2018   MCH 30.3 09/24/2018   RDW 13.5 09/24/2018   PLT 186 09/24/2018   Last metabolic panel Lab Results  Component Value Date   GLUCOSE 87 09/24/2018   NA 140 09/24/2018   K 4.2 09/24/2018   CL 106 09/24/2018   CO2 24 09/24/2018   BUN 13 09/24/2018   CREATININE 0.96 09/24/2018   GFRNONAA >60 09/24/2018   CALCIUM 8.9 09/24/2018   PROT 7.9 09/24/2018   ALBUMIN 4.6 09/24/2018   BILITOT 0.6 09/24/2018   ALKPHOS 46 09/24/2018   AST 27 09/24/2018   ALT 16 09/24/2018   ANIONGAP 10 09/24/2018   Last lipids Lab Results  Component Value Date   CHOL 176 06/10/2017    HDL 48 06/10/2017   LDLCALC 98 06/10/2017   TRIG 151 (H) 06/10/2017   CHOLHDL 3.7 06/10/2017   Last hemoglobin A1c Lab Results  Component Value Date   HGBA1C 5.8 (H) 06/10/2017   Last thyroid functions Lab Results  Component Value Date   TSH 2.322 09/17/2016   ASSESSMENT AND PLAN:   New patient, establishing care.  Health maintenance exam: Reviewed age and gender appropriate health maintenance issues (prudent diet, regular exercise, health risks of tobacco and excessive alcohol, use of seatbelts, fire alarms in home, use of sunscreen).  Also reviewed age and gender appropriate health screening as well as vaccine recommendations. Vaccines: Tdap today Labs: Health panel plus PSA and hemoglobin A1c Prostate ca screening: PSA today Colon  ca screening: Cologuard ordered  An After Visit Summary was printed and given to the patient.  Return in about 1 year (around 12/19/2024) for annual CPE (fasting).  Signed:  Arletha Lady, MD           12/20/2023

## 2023-12-20 NOTE — Patient Instructions (Signed)
 Health Maintenance, Male  Adopting a healthy lifestyle and getting preventive care are important in promoting health and wellness. Ask your health care provider about:  The right schedule for you to have regular tests and exams.  Things you can do on your own to prevent diseases and keep yourself healthy.  What should I know about diet, weight, and exercise?  Eat a healthy diet    Eat a diet that includes plenty of vegetables, fruits, low-fat dairy products, and lean protein.  Do not eat a lot of foods that are high in solid fats, added sugars, or sodium.  Maintain a healthy weight  Body mass index (BMI) is a measurement that can be used to identify possible weight problems. It estimates body fat based on height and weight. Your health care provider can help determine your BMI and help you achieve or maintain a healthy weight.  Get regular exercise  Get regular exercise. This is one of the most important things you can do for your health. Most adults should:  Exercise for at least 150 minutes each week. The exercise should increase your heart rate and make you sweat (moderate-intensity exercise).  Do strengthening exercises at least twice a week. This is in addition to the moderate-intensity exercise.  Spend less time sitting. Even light physical activity can be beneficial.  Watch cholesterol and blood lipids  Have your blood tested for lipids and cholesterol at 48 years of age, then have this test every 5 years.  You may need to have your cholesterol levels checked more often if:  Your lipid or cholesterol levels are high.  You are older than 48 years of age.  You are at high risk for heart disease.  What should I know about cancer screening?  Many types of cancers can be detected early and may often be prevented. Depending on your health history and family history, you may need to have cancer screening at various ages. This may include screening for:  Colorectal cancer.  Prostate cancer.  Skin cancer.  Lung  cancer.  What should I know about heart disease, diabetes, and high blood pressure?  Blood pressure and heart disease  High blood pressure causes heart disease and increases the risk of stroke. This is more likely to develop in people who have high blood pressure readings or are overweight.  Talk with your health care provider about your target blood pressure readings.  Have your blood pressure checked:  Every 3-5 years if you are 48-95 years of age.  Every year if you are 48 years of age old or older.  If you are between the ages of 29 and 29 and are a current or former smoker, ask your health care provider if you should have a one-time screening for abdominal aortic aneurysm (AAA).  Diabetes  Have regular diabetes screenings. This checks your fasting blood sugar level. Have the screening done:  Once every three years after age 48 if you are at a normal weight and have a low risk for diabetes.  More often and at a younger age if you are overweight or have a high risk for diabetes.  What should I know about preventing infection?  Hepatitis B  If you have a higher risk for hepatitis B, you should be screened for this virus. Talk with your health care provider to find out if you are at risk for hepatitis B infection.  Hepatitis C  Blood testing is recommended for:  Everyone born from [birth year] through 1965.  Anyone  with known risk factors for hepatitis C.  Sexually transmitted infections (STIs)  You should be screened each year for STIs, including gonorrhea and chlamydia, if:  You are sexually active and are younger than 48 years of age.  You are older than 48 years of age and your health care provider tells you that you are at risk for this type of infection.  Your sexual activity has changed since you were last screened, and you are at increased risk for chlamydia or gonorrhea. Ask your health care provider if you are at risk.  Ask your health care provider about whether you are at high risk for HIV. Your health care provider  may recommend a prescription medicine to help prevent HIV infection. If you choose to take medicine to prevent HIV, you should first get tested for HIV. You should then be tested every 3 months for as long as you are taking the medicine.  Follow these instructions at home:  Alcohol use  Do not drink alcohol if your health care provider tells you not to drink.  If you drink alcohol:  Limit how much you have to 0-2 drinks a day.  Know how much alcohol is in your drink. In the [place] one drink equals one 12 oz bottle of beer (355 mL), one 5 oz glass of wine (148 mL), or one 1 oz glass of hard liquor (44 mL).  Lifestyle  Do not use any products that contain nicotine or tobacco. These products include cigarettes, chewing tobacco, and vaping devices, such as e-cigarettes. If you need help quitting, ask your health care provider.  Do not use street drugs.  Do not share needles.  Ask your health care provider for help if you need support or information about quitting drugs.  General instructions  Schedule regular health, dental, and eye exams.  Stay current with your vaccines.  Tell your health care provider if:  You often feel depressed.  You have ever been abused or do not feel safe at home.  Summary  Adopting a healthy lifestyle and getting preventive care are important in promoting health and wellness.  Follow your health care provider's instructions about healthy diet, exercising, and getting tested or screened for diseases.  Follow your health care provider's instructions on monitoring your cholesterol and blood pressure.  This information is not intended to replace advice given to you by your health care provider. Make sure you discuss any questions you have with your health care provider.  Document Revised: 11/14/2020 Document Reviewed: 11/14/2020  Elsevier Patient Education  2024 [employer].

## 2023-12-21 LAB — PSA: PSA: 0.71 ng/mL (ref <=4.00)

## 2023-12-21 LAB — LIPID PANEL
Cholesterol: 173 mg/dL (ref <200)
HDL: 58 mg/dL (ref >=40)
LDL Cholesterol (Calc): 107 mg/dL — ABNORMAL HIGH
Non-HDL Cholesterol (Calc): 115 mg/dL (ref <130)
Total CHOL/HDL Ratio: 3 (calc) (ref <5.0)
Triglycerides: 27 mg/dL (ref <150)

## 2023-12-21 LAB — COMPREHENSIVE METABOLIC PANEL WITH GFR
AG Ratio: 1.7 (calc) (ref 1.0–2.5)
ALT: 10 U/L (ref 9–46)
AST: 17 U/L (ref 10–40)
Albumin: 4.5 g/dL (ref 3.6–5.1)
Alkaline phosphatase (APISO): 47 U/L (ref 36–130)
BUN: 12 mg/dL (ref 7–25)
CO2: 30 mmol/L (ref 20–32)
Calcium: 9.5 mg/dL (ref 8.6–10.3)
Chloride: 103 mmol/L (ref 98–110)
Creat: 0.96 mg/dL (ref 0.60–1.29)
Globulin: 2.7 g/dL (ref 1.9–3.7)
Glucose, Bld: 88 mg/dL (ref 65–99)
Potassium: 4.4 mmol/L (ref 3.5–5.3)
Sodium: 140 mmol/L (ref 135–146)
Total Bilirubin: 0.8 mg/dL (ref 0.2–1.2)
Total Protein: 7.2 g/dL (ref 6.1–8.1)
eGFR: 98 mL/min/{1.73_m2} (ref >=60)

## 2023-12-21 LAB — CBC WITH DIFFERENTIAL/PLATELET
Absolute Lymphocytes: 2256 {cells}/uL (ref 850–3900)
Absolute Monocytes: 624 {cells}/uL (ref 200–950)
Basophils Absolute: 78 {cells}/uL (ref 0–200)
Basophils Relative: 1.2 %
Eosinophils Absolute: 241 {cells}/uL (ref 15–500)
Eosinophils Relative: 3.7 %
HCT: 37.5 % — ABNORMAL LOW (ref 38.5–50.0)
Hemoglobin: 12.5 g/dL — ABNORMAL LOW (ref 13.2–17.1)
MCH: 29.4 pg (ref 27.0–33.0)
MCHC: 33.3 g/dL (ref 32.0–36.0)
MCV: 88.2 fL (ref 80.0–100.0)
MPV: 12.4 fL (ref 7.5–12.5)
Monocytes Relative: 9.6 %
Neutro Abs: 3302 {cells}/uL (ref 1500–7800)
Neutrophils Relative %: 50.8 %
Platelets: 175 10*3/uL (ref 140–400)
RBC: 4.25 10*6/uL (ref 4.20–5.80)
RDW: 12 % (ref 11.0–15.0)
Total Lymphocyte: 34.7 %
WBC: 6.5 10*3/uL (ref 3.8–10.8)

## 2023-12-21 LAB — HEMOGLOBIN A1C
Hgb A1c MFr Bld: 5.9 % — ABNORMAL HIGH (ref <5.7)
Mean Plasma Glucose: 123 mg/dL
eAG (mmol/L): 6.8 mmol/L

## 2023-12-21 LAB — TSH: TSH: 1.77 m[IU]/L (ref 0.40–4.50)

## 2023-12-21 LAB — SPECIMEN COMPROMISED

## 2023-12-27 ENCOUNTER — Ambulatory Visit: Payer: Self-pay | Admitting: Family Medicine

## 2024-01-01 NOTE — Telephone Encounter (Signed)
 No further action needed.

## 2024-01-02 LAB — COLOGUARD: COLOGUARD: NEGATIVE

## 2024-01-03 ENCOUNTER — Ambulatory Visit: Payer: Self-pay | Admitting: Family Medicine

## 2024-01-06 ENCOUNTER — Other Ambulatory Visit

## 2024-01-06 ENCOUNTER — Other Ambulatory Visit (INDEPENDENT_AMBULATORY_CARE_PROVIDER_SITE_OTHER)

## 2024-01-06 DIAGNOSIS — D649 Anemia, unspecified: Secondary | ICD-10-CM

## 2024-01-06 LAB — IBC + FERRITIN
Ferritin: 167.6 ng/mL (ref 22.0–322.0)
Iron: 111 ug/dL (ref 42–165)
Saturation Ratios: 31.1 % (ref 20.0–50.0)
TIBC: 357 ug/dL (ref 250.0–450.0)
Transferrin: 255 mg/dL (ref 212.0–360.0)

## 2024-01-06 LAB — CBC WITH DIFFERENTIAL/PLATELET
Basophils Absolute: 0.1 10*3/uL (ref 0.0–0.1)
Basophils Relative: 1.2 % (ref 0.0–3.0)
Eosinophils Absolute: 0.2 10*3/uL (ref 0.0–0.7)
Eosinophils Relative: 2.9 % (ref 0.0–5.0)
HCT: 43.1 % (ref 39.0–52.0)
Hemoglobin: 14.2 g/dL (ref 13.0–17.0)
Lymphocytes Relative: 32.6 % (ref 12.0–46.0)
Lymphs Abs: 1.9 10*3/uL (ref 0.7–4.0)
MCHC: 32.9 g/dL (ref 30.0–36.0)
MCV: 88.1 fl (ref 78.0–100.0)
Monocytes Absolute: 0.4 10*3/uL (ref 0.1–1.0)
Monocytes Relative: 7.1 % (ref 3.0–12.0)
Neutro Abs: 3.3 10*3/uL (ref 1.4–7.7)
Neutrophils Relative %: 56.2 % (ref 43.0–77.0)
Platelets: 159 10*3/uL (ref 150.0–400.0)
RBC: 4.89 Mil/uL (ref 4.22–5.81)
RDW: 13.3 % (ref 11.5–15.5)
WBC: 5.8 10*3/uL (ref 4.0–10.5)

## 2024-01-06 LAB — VITAMIN B12: Vitamin B-12: 514 pg/mL (ref 211–911)

## 2024-01-07 ENCOUNTER — Ambulatory Visit: Payer: Self-pay | Admitting: Family Medicine

## 2024-01-13 ENCOUNTER — Other Ambulatory Visit

## 2024-04-14 ENCOUNTER — Telehealth (INDEPENDENT_AMBULATORY_CARE_PROVIDER_SITE_OTHER): Admitting: Family Medicine

## 2024-04-14 VITALS — HR 93 | Temp 94.9°F

## 2024-04-14 DIAGNOSIS — J069 Acute upper respiratory infection, unspecified: Secondary | ICD-10-CM

## 2024-04-14 DIAGNOSIS — B349 Viral infection, unspecified: Secondary | ICD-10-CM

## 2024-04-14 NOTE — Progress Notes (Signed)
 Virtual Visit via Video Note  I connected with [name]  on 04/14/24 at 11:00 AM EDT by a video enabled telemedicine application and verified that I am speaking with the correct person using two identifiers.  Location patient: [place] Location provider:work or home office Persons participating in the virtual visit: patient, provider  I discussed the limitations and requested verbal permission for telemedicine visit. The patient expressed understanding and agreed to proceed.  CC: Scratchy throat  HPI: 48 year old male being seen today for scratchy throat. Onset 2 days ago, scratchy throat, nasal congestion, headache, fatigue and slight bodyaches. He does have some cough lately.  No shortness of breath or wheezing or chest pain.  He has some subjective fever and chills.  Home flu and COVID testing negative.   ROS: See pertinent positives and negatives per HPI.  Past Medical History:  Diagnosis Date   Depression    pt states only d/t subst abuse in the past   Frequent headaches    History of substance abuse (HCC)    clean since 2020    Past Surgical History:  Procedure Laterality Date   HERNIA REPAIR  2016    No current outpatient medications on file.  EXAM:  VITALS per patient if applicable:     04/14/2024   10:32 AM 12/20/2023    4:14 PM 09/24/2018    6:00 PM  Vitals with BMI  Height  5' 10   Weight  163 lbs   BMI  23.39   Systolic  119 120  Diastolic  81 89  Pulse 93 54 56     GENERAL: alert, oriented, appears well and in no acute distress  HEENT: atraumatic, conjunttiva clear, no obvious abnormalities on inspection of external nose and ears  NECK: normal movements of the head and neck  LUNGS: on inspection no signs of respiratory distress, breathing rate appears normal, no obvious gross SOB, gasping or wheezing  CV: no obvious cyanosis  MS: moves all visible extremities without noticeable abnormality  PSYCH/NEURO: pleasant and cooperative, no obvious  depression or anxiety, speech and thought processing grossly intact  LABS: none today    Chemistry      Component Value Date/Time   NA 140 12/20/2023 1632   NA 138 04/09/2014 1103   K 4.4 12/20/2023 1632   K 3.9 04/09/2014 1103   CL 103 12/20/2023 1632   CL 104 04/09/2014 1103   CO2 30 12/20/2023 1632   CO2 30 04/09/2014 1103   BUN 12 12/20/2023 1632   BUN 8 04/09/2014 1103   CREATININE 0.96 12/20/2023 1632      Component Value Date/Time   CALCIUM 9.5 12/20/2023 1632   CALCIUM 9.0 04/09/2014 1103   ALKPHOS 46 09/24/2018 1652   ALKPHOS 64 04/09/2014 1103   AST 17 12/20/2023 1632   AST 31 04/09/2014 1103   ALT 10 12/20/2023 1632   ALT 30 04/09/2014 1103   BILITOT 0.8 12/20/2023 1632   BILITOT 0.4 04/09/2014 1103     ASSESSMENT AND PLAN:  Discussed the following assessment and plan:  URI with cough, suspect viral etiology. Continue current symptomatic care with Mucinex cold and flu. Continue nasal sprays. Stay out of work today and tomorrow--> written work excuse for yesterday, today, and tomorrow.   I discussed the assessment and treatment plan with the patient. The patient was provided an opportunity to ask questions and all were answered. The patient agreed with the plan and demonstrated an understanding of the instructions.   F/u: If  not significantly improving in 3 to 4 days  Signed:  Gerlene Hockey, MD           04/14/2024

## 2024-05-19 ENCOUNTER — Telehealth (INDEPENDENT_AMBULATORY_CARE_PROVIDER_SITE_OTHER): Admitting: Family Medicine

## 2024-05-19 ENCOUNTER — Encounter: Payer: Self-pay | Admitting: Family Medicine

## 2024-05-19 DIAGNOSIS — K047 Periapical abscess without sinus: Secondary | ICD-10-CM

## 2024-05-19 DIAGNOSIS — K029 Dental caries, unspecified: Secondary | ICD-10-CM

## 2024-05-19 MED ORDER — AMOXICILLIN-POT CLAVULANATE 875-125 MG PO TABS: 1.0000 | ORAL_TABLET | Freq: Two times a day (BID) | ORAL | 0 refills | Status: AC

## 2024-05-19 NOTE — Progress Notes (Signed)
 Virtual Visit via Video Note  I connected with Russell Whitehead  on 05/19/24 at  8:00 AM EST by a video enabled telemedicine application and verified that I am speaking with the correct person using two identifiers.  Location patient: [place] Location provider:work or home office Persons participating in the virtual visit: patient, provider  I discussed the limitations and requested verbal permission for telemedicine visit. The patient expressed understanding and agreed to proceed.  HPI: 48 year old male being seen today for mouth pain. Russell Whitehead has waxing and waning chronic dental pain.  Yesterday it got acutely worse on the left maxillary region.  He has very few teeth in that region and one of the few remaining teeth is discolored and there is a little bit of swelling around it. He had to leave work early yesterday due to the pain because he could not concentrate on his work. 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 No fever or malaise.   ROS: See pertinent positives and negatives per HPI.  Past Medical History:  Diagnosis Date   Depression    pt states only d/t subst abuse in the past   Frequent headaches    History of substance abuse (HCC)    clean since 2020    Past Surgical History:  Procedure Laterality Date   HERNIA REPAIR  2016     Current Outpatient Medications:    amoxicillin -clavulanate (AUGMENTIN) 875-125 MG tablet, Take 1 tablet by mouth 2 (two) times daily., Disp: 20 tablet, Rfl: 0  EXAM:  VITALS per patient if applicable:     04/14/2024   10:32 AM 12/20/2023    4:14 PM 09/24/2018    6:00 PM  Vitals with BMI  Height  5' 10   Weight  163 lbs   BMI  23.39   Systolic  119 120  Diastolic  81 89  Pulse 93 54 56     GENERAL: alert, oriented, appears well and in no acute distress  HEENT: atraumatic, conjunttiva clear, no obvious abnormalities on inspection of external nose and ears  NECK: normal movements of the head and neck  LUNGS: on inspection no signs  of respiratory distress, breathing rate appears normal, no obvious gross SOB, gasping or wheezing  CV: no obvious cyanosis  MS: moves all visible extremities without noticeable abnormality  PSYCH/NEURO: pleasant and cooperative, no obvious depression or anxiety, speech and thought processing grossly intact  LABS: none today    Chemistry      Component Value Date/Time   NA 140 12/20/2023 1632   NA 138 04/09/2014 1103   K 4.4 12/20/2023 1632   K 3.9 04/09/2014 1103   CL 103 12/20/2023 1632   CL 104 04/09/2014 1103   CO2 30 12/20/2023 1632   CO2 30 04/09/2014 1103   BUN 12 12/20/2023 1632   BUN 8 04/09/2014 1103   CREATININE 0.96 12/20/2023 1632      Component Value Date/Time   CALCIUM 9.5 12/20/2023 1632   CALCIUM 9.0 04/09/2014 1103   ALKPHOS 46 09/24/2018 1652   ALKPHOS 64 04/09/2014 1103   AST 17 12/20/2023 1632   AST 31 04/09/2014 1103   ALT 10 12/20/2023 1632   ALT 30 04/09/2014 1103   BILITOT 0.8 12/20/2023 1632   BILITOT 0.4 04/09/2014 1103     Lab Results  Component Value Date   WBC 5.8 01/06/2024   HGB 14.2 01/06/2024   HCT 43.1 01/06/2024   MCV 88.1 01/06/2024   PLT 159.0 01/06/2024   ASSESSMENT  AND PLAN:  Discussed the following assessment and plan:  Dental pain, suspect dental abscess.  Augmentin 875 twice daily x 10 days prescribed today. Note to excuse him from work from yesterday through 05/21/2024. He has a dentist visit on 11/13. He will continue with over-the-counter naproxen alternating with Tylenol .  I discussed the assessment and treatment plan with the patient. The patient was provided an opportunity to ask questions and all were answered. The patient agreed with the plan and demonstrated an understanding of the instructions.   F/u: As needed  Signed:  Gerlene Hockey, MD           05/19/2024
